# Patient Record
Sex: Female | Born: 1985 | Race: Black or African American | Hispanic: No | Marital: Single | Smoking: Current every day smoker
Health system: Southern US, Community
[De-identification: ages and names within clinical notes are randomized; demographics above are authoritative.]

## PROBLEM LIST (undated history)

## (undated) DIAGNOSIS — F32A Depression, unspecified: Secondary | ICD-10-CM

## (undated) DIAGNOSIS — F1910 Other psychoactive substance abuse, uncomplicated: Secondary | ICD-10-CM

## (undated) DIAGNOSIS — R21 Rash and other nonspecific skin eruption: Secondary | ICD-10-CM

## (undated) DIAGNOSIS — F419 Anxiety disorder, unspecified: Secondary | ICD-10-CM

## (undated) DIAGNOSIS — K602 Anal fissure, unspecified: Secondary | ICD-10-CM

## (undated) DIAGNOSIS — Z8719 Personal history of other diseases of the digestive system: Secondary | ICD-10-CM

## (undated) DIAGNOSIS — F329 Major depressive disorder, single episode, unspecified: Secondary | ICD-10-CM

## (undated) DIAGNOSIS — D649 Anemia, unspecified: Secondary | ICD-10-CM

## (undated) DIAGNOSIS — F319 Bipolar disorder, unspecified: Secondary | ICD-10-CM

## (undated) DIAGNOSIS — Z3490 Encounter for supervision of normal pregnancy, unspecified, unspecified trimester: Secondary | ICD-10-CM

## (undated) HISTORY — DX: Anxiety disorder, unspecified: F41.9

## (undated) HISTORY — DX: Other psychoactive substance abuse, uncomplicated: F19.10

## (undated) HISTORY — DX: Anemia, unspecified: D64.9

## (undated) HISTORY — PX: FRACTURE SURGERY: SHX138

---
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
## 2017-04-29 ENCOUNTER — Encounter (HOSPITAL_COMMUNITY): Payer: Self-pay | Admitting: Emergency Medicine

## 2017-04-29 DIAGNOSIS — R51 Headache: Secondary | ICD-10-CM | POA: Insufficient documentation

## 2017-04-29 LAB — COMPREHENSIVE METABOLIC PANEL
ALBUMIN: 2.9 g/dL — AB (ref 3.5–5.0)
ALT: 12 U/L — AB (ref 14–54)
AST: 18 U/L (ref 15–41)
Alkaline Phosphatase: 69 U/L (ref 38–126)
Anion gap: 8 (ref 5–15)
BUN: 6 mg/dL (ref 6–20)
CHLORIDE: 105 mmol/L (ref 101–111)
CO2: 23 mmol/L (ref 22–32)
CREATININE: 0.54 mg/dL (ref 0.44–1.00)
Calcium: 8.6 mg/dL — ABNORMAL LOW (ref 8.9–10.3)
GFR calc Af Amer: >60 mL/min (ref >60)
GFR calc non Af Amer: >60 mL/min (ref >60)
Glucose, Bld: 122 mg/dL — ABNORMAL HIGH (ref 65–99)
Potassium: 3.9 mmol/L (ref 3.5–5.1)
SODIUM: 136 mmol/L (ref 135–145)
Total Bilirubin: 0.4 mg/dL (ref 0.3–1.2)
Total Protein: 5.8 g/dL — ABNORMAL LOW (ref 6.5–8.1)

## 2017-04-29 LAB — CBC
HCT: 29 % — ABNORMAL LOW (ref 36.0–46.0)
Hemoglobin: 9.3 g/dL — ABNORMAL LOW (ref 12.0–15.0)
MCH: 27.8 pg (ref 26.0–34.0)
MCHC: 32.1 g/dL (ref 30.0–36.0)
MCV: 86.6 fL (ref 78.0–100.0)
PLATELETS: 131 10*3/uL — AB (ref 150–400)
RBC: 3.35 MIL/uL — AB (ref 3.87–5.11)
RDW: 13.6 % (ref 11.5–15.5)
WBC: 8.8 10*3/uL (ref 4.0–10.5)

## 2017-04-29 LAB — URINALYSIS, ROUTINE W REFLEX MICROSCOPIC
Bilirubin Urine: NEGATIVE
GLUCOSE, UA: NEGATIVE mg/dL
HGB URINE DIPSTICK: NEGATIVE
Ketones, ur: NEGATIVE mg/dL
Leukocytes, UA: NEGATIVE
Nitrite: NEGATIVE
PROTEIN: NEGATIVE mg/dL
Specific Gravity, Urine: 1.016 (ref 1.005–1.030)
pH: 6 (ref 5.0–8.0)

## 2017-04-29 NOTE — ED Triage Notes (Signed)
Pt is [redacted] weeks pregnant, reports HA, N/V X1 week. Pt has taken tylenol at home with no relief. Also appears to have rash on abdomen.

## 2017-04-30 ENCOUNTER — Emergency Department (HOSPITAL_COMMUNITY)
Admission: EM | Admit: 2017-04-30 | Discharge: 2017-04-30 | Disposition: A | Discharge status: Home / Self Care | Payer: Self-pay | Attending: Emergency Medicine | Admitting: Emergency Medicine

## 2017-04-30 HISTORY — DX: Encounter for supervision of normal pregnancy, unspecified, unspecified trimester: Z34.90

## 2017-04-30 NOTE — ED Notes (Addendum)
Called for Pt to recheck for vitals. No answer x's 3.

## 2017-04-30 NOTE — ED Notes (Signed)
Pt called for recheck of vitals without answer

## 2018-01-06 ENCOUNTER — Ambulatory Visit (HOSPITAL_COMMUNITY)
Admission: EM | Admit: 2018-01-06 | Discharge: 2018-01-06 | Disposition: A | Discharge status: Home / Self Care | Payer: Self-pay | Attending: Urgent Care | Admitting: Urgent Care

## 2018-01-06 ENCOUNTER — Encounter (HOSPITAL_COMMUNITY): Payer: Self-pay | Admitting: Family Medicine

## 2018-01-06 ENCOUNTER — Telehealth (HOSPITAL_COMMUNITY): Payer: Self-pay | Admitting: Emergency Medicine

## 2018-01-06 DIAGNOSIS — B9789 Other viral agents as the cause of diseases classified elsewhere: Secondary | ICD-10-CM

## 2018-01-06 DIAGNOSIS — J029 Acute pharyngitis, unspecified: Secondary | ICD-10-CM | POA: Insufficient documentation

## 2018-01-06 DIAGNOSIS — R05 Cough: Secondary | ICD-10-CM

## 2018-01-06 DIAGNOSIS — R590 Localized enlarged lymph nodes: Secondary | ICD-10-CM

## 2018-01-06 DIAGNOSIS — R0982 Postnasal drip: Secondary | ICD-10-CM

## 2018-01-06 DIAGNOSIS — J069 Acute upper respiratory infection, unspecified: Secondary | ICD-10-CM

## 2018-01-06 DIAGNOSIS — Z720 Tobacco use: Secondary | ICD-10-CM

## 2018-01-06 DIAGNOSIS — R070 Pain in throat: Secondary | ICD-10-CM

## 2018-01-06 LAB — POCT RAPID STREP A: Streptococcus, Group A Screen (Direct): NEGATIVE

## 2018-01-06 MED ORDER — BENZONATATE 100 MG PO CAPS: 100.0000 mg | ORAL_CAPSULE | Freq: Three times a day (TID) | ORAL | 0 refills | Status: DC | PRN

## 2018-01-06 MED ORDER — HYDROCODONE-HOMATROPINE 5-1.5 MG/5ML PO SYRP: 5.0000 mL | ORAL_SOLUTION | Freq: Every evening | ORAL | 0 refills | Status: DC | PRN

## 2018-01-06 MED ORDER — PSEUDOEPHEDRINE HCL ER 120 MG PO TB12: 120.0000 mg | ORAL_TABLET | Freq: Two times a day (BID) | ORAL | 3 refills | Status: DC

## 2018-01-06 NOTE — ED Provider Notes (Signed)
  MRN: [id number] DOB: 02/23/1986  Subjective:   Mandy LemaGwendolyn Wilson is a 32 y.o. female presenting for 4-day history of sore throat, productive cough, intermittent sinus congestion, intermittent left no pain, currently worse over the right side.  She is also concerned feeling like she has sores in her mouth.  She has tried 1 pill of Augmentin that 1 of her friends gave her.  She is also tried a honey tea.  Denies fever, sinus pain, ear pain, chest pain, belly pain.  Patient smokes cigarettes.  Has a history of allergies but does not take anything consistently for this.  Patient has an IUD in place.  Otherwise, patient does not take any medications.   Allergies  Allergen Reactions  . Shellfish Allergy Hives    Past Medical History:  Diagnosis Date  . Pregnancy      Past Surgical History:  Procedure Laterality Date  . CESAREAN SECTION     X3    Objective:   Vitals: BP (!) 156/95   Pulse (!) 106   Temp 100.3 F (37.9 C)   Resp 18   LMP 11/24/2017   SpO2 99%   Physical Exam  Constitutional: She is oriented to person, place, and time. She appears well-developed and well-nourished.  HENT:  Throat with thick streaks of postnasal drainage.  No sinus tenderness.  TMs intact and without erythema.  Eyes: Right eye exhibits no discharge. Left eye exhibits no discharge. No scleral icterus.  Neck: Normal range of motion. Neck supple.  Right-sided submandibular lymph adenopathy.  Cardiovascular: Normal rate, regular rhythm and intact distal pulses. Exam reveals no gallop and no friction rub.  No murmur heard. Pulmonary/Chest: No respiratory distress. She has no wheezes. She has no rales.  Lymphadenopathy:    She has cervical adenopathy.  Neurological: She is alert and oriented to person, place, and time.  Skin: Skin is warm and dry.  Psychiatric: She has a normal mood and affect.    Results for orders placed or performed during the hospital encounter of 01/06/18 (from the past 24  hour(s))  POCT rapid strep [facility])     Status: None   Collection Time: 01/06/18  5:48 PM  Result Value Ref Range   Streptococcus, Group A Screen (Direct) NEGATIVE NEGATIVE    Assessment and Plan :   Viral URI with cough  Throat pain  Cervical lymphadenopathy  Post-nasal drainage  Tobacco use  Strep culture pending, will manage as a viral upper respiratory infection.  Recommended patient hold off from smoking cigarettes at least until she is better. Counseled patient on potential for adverse effects with medications prescribed today, patient verbalized understanding. Return-to-clinic precautions discussed, patient verbalized understanding.    Wallis BambergMani, Laira Penninger, [place]PA-C 01/06/18 1821

## 2018-01-06 NOTE — ED Triage Notes (Signed)
Pt here for cough, nausea, sore throat and sore in mouth x a few days. Reports some lymphadenopathy and ear pain the first few days on the left  And now to the right. sts sore to tongue and blister.

## 2018-01-06 NOTE — Discharge Instructions (Signed)
Hydrate well with at least 2 liters (1 gallon) of water daily. You may take 500mg  Tylenol with ibuprofen 600mg  every 6 hours for pain and inflammation. For sore throat try using a honey-based tea. Use 3 teaspoons of honey with juice squeezed from half lemon. Place shaved pieces of ginger into 1/2-1 cup of water and warm over stove top. Then mix the ingredients and repeat every 4 hours as needed.

## 2018-01-09 LAB — CULTURE, GROUP A STREP (THRC)

## 2018-03-24 ENCOUNTER — Ambulatory Visit (INDEPENDENT_AMBULATORY_CARE_PROVIDER_SITE_OTHER): Payer: Medicaid Other

## 2018-03-24 ENCOUNTER — Ambulatory Visit (HOSPITAL_COMMUNITY)
Admission: EM | Admit: 2018-03-24 | Discharge: 2018-03-24 | Disposition: A | Discharge status: Home / Self Care | Payer: Medicaid Other | Attending: Family Medicine | Admitting: Family Medicine

## 2018-03-24 ENCOUNTER — Other Ambulatory Visit: Payer: Self-pay

## 2018-03-24 ENCOUNTER — Encounter (HOSPITAL_COMMUNITY): Payer: Self-pay | Admitting: Emergency Medicine

## 2018-03-24 DIAGNOSIS — S9032XA Contusion of left foot, initial encounter: Secondary | ICD-10-CM

## 2018-03-24 DIAGNOSIS — Z3202 Encounter for pregnancy test, result negative: Secondary | ICD-10-CM | POA: Diagnosis not present

## 2018-03-24 DIAGNOSIS — R109 Unspecified abdominal pain: Secondary | ICD-10-CM | POA: Diagnosis not present

## 2018-03-24 DIAGNOSIS — K529 Noninfective gastroenteritis and colitis, unspecified: Secondary | ICD-10-CM

## 2018-03-24 LAB — POCT URINALYSIS DIP (DEVICE)
GLUCOSE, UA: NEGATIVE mg/dL
KETONES UR: 15 mg/dL — AB
LEUKOCYTES UA: NEGATIVE
Nitrite: NEGATIVE
PROTEIN: NEGATIVE mg/dL
UROBILINOGEN UA: 0.2 mg/dL (ref 0.0–1.0)
pH: 6 (ref 5.0–8.0)

## 2018-03-24 LAB — POCT PREGNANCY, URINE: Preg Test, Ur: NEGATIVE

## 2018-03-24 MED ORDER — ONDANSETRON 4 MG PO TBDP
4.0000 mg | ORAL_TABLET | Freq: Once | ORAL | Status: AC
  Administered 2018-03-24: 4 mg via ORAL

## 2018-03-24 MED ORDER — ONDANSETRON HCL 4 MG PO TABS: 4.0000 mg | ORAL_TABLET | Freq: Three times a day (TID) | ORAL | 0 refills | Status: DC | PRN

## 2018-03-24 MED ORDER — ACETAMINOPHEN 500 MG PO TABS: 500.0000 mg | ORAL_TABLET | Freq: Four times a day (QID) | ORAL | 0 refills | Status: DC | PRN

## 2018-03-24 MED ORDER — ONDANSETRON 4 MG PO TBDP
ORAL_TABLET | ORAL | Status: AC
  Filled 2018-03-24: qty 1

## 2018-03-24 MED ORDER — IBUPROFEN 400 MG PO TABS: 400.0000 mg | ORAL_TABLET | Freq: Four times a day (QID) | ORAL | 0 refills | Status: DC | PRN

## 2018-03-24 NOTE — ED Triage Notes (Signed)
Nausea, vomiting and diarrhea, hot and cold flashes for 3 days.    Kicked a toy during the night, pain in top of foot

## 2018-03-24 NOTE — ED Provider Notes (Signed)
[facility]    CSN: [id number] Arrival date & time: 03/24/18  1901     History   Chief Complaint Chief Complaint  Patient presents with  . Emesis    HPI Mandy Wilson is a 32 y.o. female.   Mandy Wilson presents with complaints of body aches, nausea, vomiting and diarrhea which started two days ago. Fatigue. Vomited last approximately 4 hours ago. Has vomited 3 times today. Diarrhea approximately 5 times today. Generalized abdominal pain. No fevers. No cough or congestion. No rash. No known ill contacts. Has been taking clear liquids today. Urinating without difficulty. Has had pelvic pain and irregular periods for the past month, has an IUD and states has had these symptoms since IUD was placed. No change to these symptoms. Took one of her mother's muscle relaxer this morning which seemed to help with body aches. Does not have a PCP.  States also has left foot pain, primarily at 2nd toe. States kicked a wall years ago and then last night kicked a toy in the same area and the pain was triggered again. No numbness or tingling. Ambulatory.    ROS per HPI.      Past Medical History:  Diagnosis Date  . Pregnancy     There are no active problems to display for this patient.   Past Surgical History:  Procedure Laterality Date  . CESAREAN SECTION     X3    OB History    Gravida  1   Para      Term      Preterm      AB      Living        SAB      TAB      Ectopic      Multiple      Live Births               Home Medications    Prior to Admission medications   Medication Sig Start Date End Date Taking? Authorizing Provider  cyclobenzaprine (FLEXERIL) 10 MG tablet Take 10 mg by mouth 3 (three) times daily as needed for muscle spasms.   Yes [provider]  acetaminophen (TYLENOL) 500 MG tablet Take 1 tablet (500 mg total) by mouth every 6 (six) hours as needed. 03/24/18   Georgetta Haber, NP  ibuprofen (ADVIL,MOTRIN) 400 MG tablet  Take 1 tablet (400 mg total) by mouth every 6 (six) hours as needed. 03/24/18   Georgetta Haber, NP  ondansetron (ZOFRAN) 4 MG tablet Take 1 tablet (4 mg total) by mouth every 8 (eight) hours as needed for nausea or vomiting. 03/24/18   Georgetta Haber, NP    Family History Family History  Problem Relation Age of Onset  . Asthma Mother   . Asthma Father     Social History Social History   Tobacco Use  . Smoking status: Current Every Day Smoker  . Smokeless tobacco: Never Used  Substance Use Topics  . Alcohol use: Yes  . Drug use: Yes    Types: Marijuana     Allergies   Shellfish allergy   Review of Systems Review of Systems   Physical Exam Triage Vital Signs ED Triage Vitals  Enc Vitals Group     BP 03/24/18 2011 137/85     Pulse Rate 03/24/18 2011 67     Resp 03/24/18 2011 20     Temp 03/24/18 2011 98.4 F (36.9 C)     Temp  Source 03/24/18 2011 Oral     SpO2 03/24/18 2011 96 %     Weight --      Height --      Head Circumference --      Peak Flow --      Pain Score 03/24/18 2008 10     Pain Loc --      Pain Edu? --      Excl. in GC? --    No data found.  Updated Vital Signs BP 137/85 (BP Location: Right Arm) Comment (BP Location): small cuff  Pulse 67   Temp 98.4 F (36.9 C) (Oral)   Resp 20   LMP 02/24/2018   SpO2 96%    Physical Exam  Constitutional: She is oriented to person, place, and time. She appears well-developed and well-nourished. No distress.  Cardiovascular: Normal rate, regular rhythm and normal heart sounds.  Pulmonary/Chest: Effort normal and breath sounds normal.  Abdominal: Soft. Bowel sounds are normal. There is no tenderness. There is no rigidity, no rebound, no guarding and no CVA tenderness.  Musculoskeletal:       Left ankle: Normal.       Left foot: There is tenderness and bony tenderness. There is normal range of motion, no swelling, normal capillary refill, no crepitus, no deformity and no laceration.        Feet:  Strong cap refill; moving toes, point tenderness over second distal metatarsal, no pain at joint; pedal pulse strong and palpable   Neurological: She is alert and oriented to person, place, and time.  Skin: Skin is warm and dry.     UC Treatments / Results  Labs (all labs ordered are listed, but only abnormal results are displayed) Labs Reviewed  POCT URINALYSIS DIP (DEVICE) - Abnormal; Notable for the following components:      Result Value   Bilirubin Urine SMALL (*)    Ketones, ur 15 (*)    Hgb urine dipstick MODERATE (*)    All other components within normal limits  POCT PREGNANCY, URINE    EKG None  Radiology Dg Foot Complete Left  Result Date: 03/24/2018 CLINICAL DATA:  Tripped over a toy a, pain along the plantar surface EXAM: LEFT FOOT - COMPLETE 3+ VIEW COMPARISON:  None. FINDINGS: No acute displaced fracture or malalignment. No radiopaque foreign body in the soft tissues. IMPRESSION: No acute osseous abnormality Electronically Signed   By: Jasmine PangKim  Fujinaga M.D.   On: 03/24/2018 20:42    Procedures Procedures (including critical care time)  Medications Ordered in UC Medications  ondansetron (ZOFRAN-ODT) disintegrating tablet 4 mg (4 mg Oral Given 03/24/18 2028)    Initial Impression / Assessment and Plan / UC Course  I have reviewed the triage vital signs and the nursing notes.  Pertinent labs & imaging results that were available during my care of the patient were reviewed by me and considered in my medical decision making (see chart for details).     No current vaginal or urinary symptoms. Non tender abdomen on exam. Non toxic in appearance. Vomiting has been decreasing. Has been taking fluids. Likely viral in nature. zofran as needed. Ibuprofen for pain to foot, ice and elevation. Ibuprofen once abdominal symptoms have improved. Continue to follow with PCP and/or gynecologist for IUD follow up- patient states would like it removed. Return precautions  provided. Patient verbalized understanding and agreeable to plan.  Ambulatory out of clinic without difficulty.    Final Clinical Impressions(s) / UC Diagnoses   Final  diagnoses:  Gastroenteritis  Contusion of left foot, initial encounter     Discharge Instructions     Small frequent sips of fluids- Pedialyte, Gatorade, water, broth- to maintain hydration.   Zofran as needed for nausea or vomiting.  Ibuprofen or tylenol for foot pain. Ibuprofen can cause increased abdominal symptoms therefore use once abdominal symptoms have improved. May help with body aches.  Ice and elevation of foot. Please follow up with PCP and/or gyn for IUD/ menstruation management     ED Prescriptions    Medication Sig Dispense Auth. Provider   ondansetron (ZOFRAN) 4 MG tablet Take 1 tablet (4 mg total) by mouth every 8 (eight) hours as needed for nausea or vomiting. 10 tablet Linus Mako B, NP   ibuprofen (ADVIL,MOTRIN) 400 MG tablet Take 1 tablet (400 mg total) by mouth every 6 (six) hours as needed. 30 tablet Linus Mako B, NP   acetaminophen (TYLENOL) 500 MG tablet Take 1 tablet (500 mg total) by mouth every 6 (six) hours as needed. 30 tablet Georgetta Haber, NP     Controlled Substance Prescriptions [place] Controlled Substance Registry consulted? Not Applicable   Georgetta Haber, NP 03/24/18 2053

## 2018-03-24 NOTE — Discharge Instructions (Addendum)
Small frequent sips of fluids- Pedialyte, Gatorade, water, broth- to maintain hydration.   Zofran as needed for nausea or vomiting.  Ibuprofen or tylenol for foot pain. Ibuprofen can cause increased abdominal symptoms therefore use once abdominal symptoms have improved. May help with body aches.  Ice and elevation of foot. Please follow up with PCP and/or gyn for IUD/ menstruation management

## 2018-04-25 ENCOUNTER — Emergency Department (HOSPITAL_COMMUNITY)
Admission: EM | Admit: 2018-04-25 | Discharge: 2018-04-26 | Disposition: A | Discharge status: Home / Self Care | Payer: Medicaid Other | Attending: Emergency Medicine | Admitting: Emergency Medicine

## 2018-04-25 ENCOUNTER — Encounter (HOSPITAL_COMMUNITY): Payer: Self-pay | Admitting: Emergency Medicine

## 2018-04-25 ENCOUNTER — Other Ambulatory Visit: Payer: Self-pay

## 2018-04-25 DIAGNOSIS — T6591XA Toxic effect of unspecified substance, accidental (unintentional), initial encounter: Secondary | ICD-10-CM | POA: Insufficient documentation

## 2018-04-25 DIAGNOSIS — F1220 Cannabis dependence, uncomplicated: Secondary | ICD-10-CM | POA: Insufficient documentation

## 2018-04-25 DIAGNOSIS — F319 Bipolar disorder, unspecified: Secondary | ICD-10-CM | POA: Insufficient documentation

## 2018-04-25 DIAGNOSIS — F1020 Alcohol dependence, uncomplicated: Secondary | ICD-10-CM | POA: Insufficient documentation

## 2018-04-25 DIAGNOSIS — F10920 Alcohol use, unspecified with intoxication, uncomplicated: Secondary | ICD-10-CM | POA: Diagnosis not present

## 2018-04-25 DIAGNOSIS — F17200 Nicotine dependence, unspecified, uncomplicated: Secondary | ICD-10-CM | POA: Diagnosis not present

## 2018-04-25 DIAGNOSIS — F17210 Nicotine dependence, cigarettes, uncomplicated: Secondary | ICD-10-CM | POA: Diagnosis not present

## 2018-04-25 DIAGNOSIS — F4325 Adjustment disorder with mixed disturbance of emotions and conduct: Secondary | ICD-10-CM | POA: Diagnosis not present

## 2018-04-25 DIAGNOSIS — F419 Anxiety disorder, unspecified: Secondary | ICD-10-CM | POA: Diagnosis not present

## 2018-04-25 HISTORY — DX: Depression, unspecified: F32.A

## 2018-04-25 HISTORY — DX: Bipolar disorder, unspecified: F31.9

## 2018-04-25 HISTORY — DX: Major depressive disorder, single episode, unspecified: F32.9

## 2018-04-25 LAB — COMPREHENSIVE METABOLIC PANEL
ALBUMIN: 3.7 g/dL (ref 3.5–5.0)
ALK PHOS: 50 U/L (ref 38–126)
ALT: 31 U/L (ref 0–44)
AST: 26 U/L (ref 15–41)
Anion gap: 10 (ref 5–15)
BUN: 14 mg/dL (ref 6–20)
CHLORIDE: 109 mmol/L (ref 98–111)
CO2: 23 mmol/L (ref 22–32)
CREATININE: 0.74 mg/dL (ref 0.44–1.00)
Calcium: 8.4 mg/dL — ABNORMAL LOW (ref 8.9–10.3)
GFR calc Af Amer: >60 mL/min (ref >60)
GFR calc non Af Amer: >60 mL/min (ref >60)
GLUCOSE: 78 mg/dL (ref 70–99)
Potassium: 3.5 mmol/L (ref 3.5–5.1)
SODIUM: 142 mmol/L (ref 135–145)
Total Bilirubin: 0.6 mg/dL (ref 0.3–1.2)
Total Protein: 6.1 g/dL — ABNORMAL LOW (ref 6.5–8.1)

## 2018-04-25 LAB — I-STAT BETA HCG BLOOD, ED (MC, WL, AP ONLY)

## 2018-04-25 LAB — ACETAMINOPHEN LEVEL

## 2018-04-25 LAB — I-STAT VENOUS BLOOD GAS, ED
ACID-BASE DEFICIT: 2 mmol/L (ref 0.0–2.0)
Bicarbonate: 24.3 mmol/L (ref 20.0–28.0)
O2 Saturation: 75 %
PH VEN: 7.349 (ref 7.250–7.430)
PO2 VEN: 42 mmHg (ref 32.0–45.0)
TCO2: 26 mmol/L (ref 22–32)
pCO2, Ven: 44 mmHg (ref 44.0–60.0)

## 2018-04-25 LAB — RAPID URINE DRUG SCREEN, HOSP PERFORMED
Amphetamines: NOT DETECTED
BARBITURATES: NOT DETECTED
Benzodiazepines: NOT DETECTED
Cocaine: NOT DETECTED
Opiates: NOT DETECTED
Tetrahydrocannabinol: POSITIVE — AB

## 2018-04-25 LAB — CBC
HEMATOCRIT: 37.6 % (ref 36.0–46.0)
Hemoglobin: 11.7 g/dL — ABNORMAL LOW (ref 12.0–15.0)
MCH: 28.6 pg (ref 26.0–34.0)
MCHC: 31.1 g/dL (ref 30.0–36.0)
MCV: 91.9 fL (ref 78.0–100.0)
Platelets: 170 10*3/uL (ref 150–400)
RBC: 4.09 MIL/uL (ref 3.87–5.11)
RDW: 13.6 % (ref 11.5–15.5)
WBC: 5.5 10*3/uL (ref 4.0–10.5)

## 2018-04-25 LAB — ETHANOL: Alcohol, Ethyl (B): 125 mg/dL — ABNORMAL HIGH (ref <10)

## 2018-04-25 LAB — SALICYLATE LEVEL: Salicylate Lvl: <7 mg/dL (ref 2.8–30.0)

## 2018-04-25 MED ORDER — VITAMIN B-1 100 MG PO TABS
100.0000 mg | ORAL_TABLET | Freq: Every day | ORAL | Status: DC
  Administered 2018-04-25: 100 mg via ORAL
  Filled 2018-04-25: qty 1

## 2018-04-25 MED ORDER — LORAZEPAM 1 MG PO TABS: 0.0000 mg | ORAL_TABLET | Freq: Four times a day (QID) | ORAL | Status: DC

## 2018-04-25 MED ORDER — LORAZEPAM 1 MG PO TABS: 0.0000 mg | ORAL_TABLET | Freq: Two times a day (BID) | ORAL | Status: DC

## 2018-04-25 MED ORDER — ACETAMINOPHEN 325 MG PO TABS: 650.0000 mg | ORAL_TABLET | ORAL | Status: DC | PRN

## 2018-04-25 MED ORDER — LORAZEPAM 2 MG/ML IJ SOLN: 0.0000 mg | Freq: Two times a day (BID) | INTRAMUSCULAR | Status: DC

## 2018-04-25 MED ORDER — SODIUM CHLORIDE 0.9 % IV BOLUS
1000.0000 mL | Freq: Once | INTRAVENOUS | Status: AC
  Administered 2018-04-25: 1000 mL via INTRAVENOUS

## 2018-04-25 MED ORDER — ONDANSETRON HCL 4 MG PO TABS: 4.0000 mg | ORAL_TABLET | Freq: Three times a day (TID) | ORAL | Status: DC | PRN

## 2018-04-25 MED ORDER — NALOXONE HCL 0.4 MG/ML IJ SOLN
0.4000 mg | Freq: Once | INTRAMUSCULAR | Status: AC
  Administered 2018-04-25: 0.4 mg via INTRAVENOUS
  Filled 2018-04-25: qty 1

## 2018-04-25 MED ORDER — LORAZEPAM 2 MG/ML IJ SOLN: 0.0000 mg | Freq: Four times a day (QID) | INTRAMUSCULAR | Status: DC

## 2018-04-25 MED ORDER — THIAMINE HCL 100 MG/ML IJ SOLN: 100.0000 mg | Freq: Every day | INTRAMUSCULAR | Status: DC

## 2018-04-25 MED ORDER — ALUM & MAG HYDROXIDE-SIMETH 200-200-20 MG/5ML PO SUSP: 30.0000 mL | Freq: Four times a day (QID) | ORAL | Status: DC | PRN

## 2018-04-25 NOTE — ED Triage Notes (Signed)
Pt BIB [facility] after ingesting an unknown white pill that is prescribed to her mother for pain, and drinking 16oz of brandy. Pt lethargic, but easily aroused. States she has not been on her medication for the last year and has been feeling overwhelmed. Pt denying SI/HI at this time.

## 2018-04-25 NOTE — ED Provider Notes (Signed)
[facility] EMERGENCY DEPARTMENT Provider Note   CSN: [id number] Arrival date & time: 04/25/18  1820     History   Chief Complaint Chief Complaint  Patient presents with  . Ingestion    HPI Mandy Wilson is a 32 y.o. female.  HPI 32 year old female with past medical history of bipolar disorder, depression, here with intoxication.  History limited due to intoxication.  Per report, the patient was drinking because she got into an emotional argument with her significant other, then took a pain pill because her ankle hurts.  She does admit she has been off of her psychiatric medications.  She denies any suicidal ideation but is markedly intoxicated at this time, limiting history.  Level 5 caveat invoked as remainder of history, ROS, and physical exam limited due to patient's intoxication/AMS.   Past Medical History:  Diagnosis Date  . Bipolar 1 disorder (HCC)   . Depression   . Pregnancy     There are no active problems to display for this patient.   Past Surgical History:  Procedure Laterality Date  . CESAREAN SECTION     X3     OB History    Gravida  1   Para      Term      Preterm      AB      Living        SAB      TAB      Ectopic      Multiple      Live Births               Home Medications    Prior to Admission medications   Medication Sig Start Date End Date Taking? Authorizing Provider  ibuprofen (ADVIL,MOTRIN) 200 MG tablet Take 200-400 mg by mouth every 6 (six) hours as needed for headache (pain).   Yes [provider]  acetaminophen (TYLENOL) 500 MG tablet Take 1 tablet (500 mg total) by mouth every 6 (six) hours as needed. Patient not taking: Reported on 04/25/2018 03/24/18   Linus Mako B, NP  ibuprofen (ADVIL,MOTRIN) 400 MG tablet Take 1 tablet (400 mg total) by mouth every 6 (six) hours as needed. Patient not taking: Reported on 04/25/2018 03/24/18   Linus Mako B, NP  ondansetron (ZOFRAN) 4 MG tablet  Take 1 tablet (4 mg total) by mouth every 8 (eight) hours as needed for nausea or vomiting. Patient not taking: Reported on 04/25/2018 03/24/18   Georgetta Haber, NP    Family History Family History  Problem Relation Age of Onset  . Asthma Mother   . Asthma Father     Social History Social History   Tobacco Use  . Smoking status: Current Every Day Smoker  . Smokeless tobacco: Never Used  Substance Use Topics  . Alcohol use: Yes  . Drug use: Yes    Types: Marijuana     Allergies   Shellfish allergy   Review of Systems Review of Systems  Unable to perform ROS: Mental status change     Physical Exam Updated Vital Signs BP 116/71   Pulse 74   Temp (!) 97.5 F (36.4 C) (Temporal)   Resp 18   LMP 02/24/2018   SpO2 98%   Breastfeeding? Unknown   Physical Exam  Constitutional: She appears well-developed and well-nourished. No distress.  HENT:  Head: Normocephalic and atraumatic.  No apparent head trauma  Eyes: Conjunctivae are normal.  Conjunctival injection b/l, pinpoint pupils  Neck: Neck supple.  Cardiovascular: Normal rate, regular rhythm and normal heart sounds. Exam reveals no friction rub.  No murmur heard. Pulmonary/Chest: Effort normal and breath sounds normal. No respiratory distress. She has no wheezes. She has no rales.  Abdominal: She exhibits no distension.  Musculoskeletal: She exhibits no edema.  Neurological: She is alert. She exhibits normal muscle tone.  Oriented to person and place, not time. Speech is slurred. Easily falls asleep  Skin: Skin is warm. Capillary refill takes less than 2 seconds.  Psychiatric: She has a normal mood and affect.  Nursing note and vitals reviewed.    ED Treatments / Results  Labs (all labs ordered are listed, but only abnormal results are displayed) Labs Reviewed  COMPREHENSIVE METABOLIC PANEL - Abnormal; Notable for the following components:      Result Value   Calcium 8.4 (*)    Total Protein 6.1 (*)     All other components within normal limits  ETHANOL - Abnormal; Notable for the following components:   Alcohol, Ethyl (B) 125 (*)    All other components within normal limits  CBC - Abnormal; Notable for the following components:   Hemoglobin 11.7 (*)    All other components within normal limits  ACETAMINOPHEN LEVEL - Abnormal; Notable for the following components:   Acetaminophen (Tylenol), Serum <10 (*)    All other components within normal limits  SALICYLATE LEVEL  RAPID URINE DRUG SCREEN, HOSP PERFORMED  I-STAT BETA HCG BLOOD, ED ([facility], [facility], [facility] ONLY)  I-STAT VENOUS BLOOD GAS, ED    EKG None  Radiology No results found.  Procedures Procedures (including critical care time)  Medications Ordered in ED Medications  ondansetron (ZOFRAN) tablet 4 mg (has no administration in time range)  alum & mag hydroxide-simeth (MAALOX/MYLANTA) 200-200-20 MG/5ML suspension 30 mL (has no administration in time range)  acetaminophen (TYLENOL) tablet 650 mg (has no administration in time range)  LORazepam (ATIVAN) injection 0-4 mg (has no administration in time range)    Or  LORazepam (ATIVAN) tablet 0-4 mg (has no administration in time range)  LORazepam (ATIVAN) injection 0-4 mg (has no administration in time range)    Or  LORazepam (ATIVAN) tablet 0-4 mg (has no administration in time range)  thiamine (VITAMIN B-1) tablet 100 mg (has no administration in time range)    Or  thiamine (B-1) injection 100 mg (has no administration in time range)  naloxone [facility](NARCAN) injection 0.4 mg (0.4 mg Intravenous Given 04/25/18 1900)  sodium chloride 0.9 % bolus 1,000 mL (1,000 mLs Intravenous New Bag/Given 04/25/18 1904)     Initial Impression / Assessment and Plan / ED Course  I have reviewed the triage vital signs and the nursing notes.  Pertinent labs & imaging results that were available during my care of the patient were reviewed by me and considered in my medical decision making (see chart for  details).   32 year old female here with intoxication after drinking and taking unknown pain medication.  Patient given Narcan and monitored in the ED.  She is increasingly sober now awake and alert.  She does state that taking the opiate was accidental and it was given to her by her mother due to chronic foot pain.  However, she does state that she has been drinking more than usual and is requesting psychiatric evaluation given increasing agitation and outbursts in the setting of not taking her bipolar meds since her 8471-month-old child was born.  Will consult TTS.  Otherwise, she is medically  stable for psychiatric disposition currently.   Final Clinical Impressions(s) / ED Diagnoses   Final diagnoses:  Accidental ingestion of substance, initial encounter  Alcoholic intoxication without complication [facility])    ED Discharge Orders    None       Shaune Pollack, MD 04/25/18 2108

## 2018-04-25 NOTE — Progress Notes (Signed)
Called charge nurse and she states pt is being transferred to [facility]. TTS to contact RN to set up telepsych cart once pt has been moved.   Mandy Wilson, MSW, LCSW Therapeutic Triage Specialist  [phone]

## 2018-04-25 NOTE — Progress Notes (Signed)
Per [name], DNP pt is recommended for continued observation for safety and stabilization. Pt to be reassessed in the AM by psych. EDP Mandy Wilson, Cameron, MD and pt's nurse Mandy PicketScott, RN have been advised of the recommendation.   Mandy Wilson, MSW, LCSW Therapeutic Triage Specialist  [phone]

## 2018-04-25 NOTE — ED Notes (Signed)
Patient speaking loudly on the phone with her "baby daddy", agitated. Patient apologized and calmed down, now cooperative.

## 2018-04-25 NOTE — ED Notes (Signed)
Valuables placed with security. 

## 2018-04-25 NOTE — BH Assessment (Addendum)
Tele Assessment Note   Patient Name: Mandy Wilson MRN: [id number] Referring Physician: Shaune PollackIsaacs, Cameron, MD Location of Patient: [facility] Location of Provider: Behavioral Health TTS Department  Mandy Wilson is an 32 y.o. female who presents to the ED VOL. TTS asked the pt what concerns prompted her to come to the ED and she states "my daughter spilled some water and it went all crazy." TTS asked the pt to provide clarity on "all crazy" and pt states she began to yell at her 31 year old daughter. Pt denies that any violence took place during the incident.   Pt admits to taking an unknown pill given to her by her mother due to foot pain. Pt also admits to drinking heavy amounts of alcohol and states she drinks "sometimes a little, sometimes a lot." Pt's current BAL is 125 on arrival to ED. Pt also admits to using cannabis weekly. Pt denies SI, HI, and AVH. Pt states she did not intentionally OD on medication however per chart, pt was lethargic on arrival to ED and had to be administered narcan while in the ED.   Per [name], DNP pt is recommended for continued observation for safety and stabilization. Pt to be reassessed in the AM by psych. EDP Shaune PollackIsaacs, Cameron, MD and pt's nurse Lorin PicketScott, RN have been advised of the recommendation.   Diagnosis: Bipolar I disorder, per hx; Alcohol use disorder, severe; Cannabis use disorder, severe   Past Medical History:  Past Medical History:  Diagnosis Date  . Bipolar 1 disorder (HCC)   . Depression   . Pregnancy     Past Surgical History:  Procedure Laterality Date  . CESAREAN SECTION     X3    Family History:  Family History  Problem Relation Age of Onset  . Asthma Mother   . Asthma Father     Social History:  reports that she has been smoking.  She has never used smokeless tobacco. She reports that she drinks alcohol. She reports that she has current or past drug history. Drug: Marijuana.  Additional Social History:  Alcohol / Drug  Use Pain Medications: See MAR Prescriptions: See MAR Over the Counter: See MAR History of alcohol / drug use?: Yes Substance #1 Name of Substance 1: Alcohol 1 - Age of First Use: 22 1 - Amount (size/oz): varies 1 - Frequency: social occasions 1 - Duration: ongoing 1 - Last Use / Amount: 04/25/18 Substance #2 Name of Substance 2: Cannabis 2 - Age of First Use: 15 2 - Amount (size/oz): varies 2 - Frequency: 1x/week 2 - Duration: ongoing 2 - Last Use / Amount: 04/25/18  CIWA: CIWA-Ar BP: 121/74 Pulse Rate: 90 Nausea and Vomiting: no nausea and no vomiting Tactile Disturbances: none Tremor: no tremor Auditory Disturbances: not present Paroxysmal Sweats: no sweat visible Visual Disturbances: not present Anxiety: no anxiety, at ease Headache, Fullness in Head: none present Agitation: two Orientation and Clouding of Sensorium: oriented and can do serial additions CIWA-Ar Total: 2 COWS:    Allergies:  Allergies  Allergen Reactions  . Shellfish Allergy Hives    Home Medications:  (Not in a hospital admission)  OB/GYN Status:  Patient's last menstrual period was 02/24/2018.  General Assessment Data Assessment unable to be completed: Yes Reason for not completing assessment: Called charge nurse and she states pt is being transferred to POD F. TTS to contact RN to set up telepsych cart once pt has been moved.  [facility] Assessment: In system  Is this a Tele or Face-to-Face Assessment?: Tele Assessment Is this an Initial Assessment or a Re-assessment for this encounter?: Initial Assessment Marital status: Single Is patient pregnant?: No Pregnancy Status: No Living Arrangements: Children, Parent Can pt return to current living arrangement?: Yes Admission Status: Voluntary Is patient capable of signing voluntary admission?: Yes Referral Source: Self/Family/Friend Insurance type: Medicaid     Crisis Care Plan Living Arrangements: Children,  Parent Name of Psychiatrist: none Name of Therapist: none  Education Status Is patient currently in school?: No Is the patient employed, unemployed or receiving disability?: Unemployed  Risk to self with the past 6 months Suicidal Ideation: No Has patient been a risk to self within the past 6 months prior to admission? : Yes(accidental OD) Suicidal Intent: No Has patient had any suicidal intent within the past 6 months prior to admission? : No Is patient at risk for suicide?: No Suicidal Plan?: No Has patient had any suicidal plan within the past 6 months prior to admission? : No Access to Means: No What has been your use of drugs/alcohol within the last 12 months?: frequent alcohol and cannabis use  Previous Attempts/Gestures: No Triggers for Past Attempts: None known Intentional Self Injurious Behavior: None Family Suicide History: No Recent stressful life event(s): Financial Problems, Job Loss(not working, increased alcohol use ) Persecutory voices/beliefs?: No Depression: No Substance abuse history and/or treatment for substance abuse?: Yes Suicide prevention information given to non-admitted patients: Not applicable  Risk to Others within the past 6 months Homicidal Ideation: No Does patient have any lifetime risk of violence toward others beyond the six months prior to admission? : No Thoughts of Harm to Others: No Current Homicidal Intent: No Current Homicidal Plan: No Access to Homicidal Means: No History of harm to others?: No Assessment of Violence: None Noted Does patient have access to weapons?: No Criminal Charges Pending?: No Does patient have a court date: No Is patient on probation?: No  Psychosis Hallucinations: None noted Delusions: None noted  Mental Status Report Appearance/Hygiene: In scrubs Eye Contact: Poor(pt falling asleep) Motor Activity: Freedom of movement Speech: Slow Level of Consciousness: Drowsy Mood: Euthymic Affect: Flat Anxiety  Level: None Thought Processes: Relevant, Coherent Judgement: Partial Orientation: Person, Place, Time, Situation Obsessive Compulsive Thoughts/Behaviors: None  Cognitive Functioning Concentration: Poor Memory: Remote Intact, Recent Intact Is patient IDD: No Is patient DD?: No Insight: Poor Impulse Control: Fair Appetite: Good Have you had any weight changes? : No Change Sleep: Decreased Total Hours of Sleep: 3 Vegetative Symptoms: None  ADLScreening [facility] Assessment Services) Patient's cognitive ability adequate to safely complete daily activities?: Yes Patient able to express need for assistance with ADLs?: Yes Independently performs ADLs?: Yes (appropriate for developmental age)  Prior Inpatient Therapy Prior Inpatient Therapy: No  Prior Outpatient Therapy Prior Outpatient Therapy: No Does patient have an ACCT team?: No Does patient have Intensive In-House Services?  : No Does patient have [facility] services? : No Does patient have P4CC services?: No  ADL Screening (condition at time of admission) Patient's cognitive ability adequate to safely complete daily activities?: Yes Is the patient deaf or have difficulty hearing?: No Does the patient have difficulty seeing, even when wearing glasses/contacts?: No Does the patient have difficulty concentrating, remembering, or making decisions?: No Patient able to express need for assistance with ADLs?: Yes Does the patient have difficulty dressing or bathing?: No Independently performs ADLs?: Yes (appropriate for developmental age) Does the patient have difficulty walking or climbing stairs?: No Weakness of Legs: None  Weakness of Arms/Hands: None  Home Assistive Devices/Equipment Home Assistive Devices/Equipment: None    Abuse/Neglect Assessment (Assessment to be complete while patient is alone) Abuse/Neglect Assessment Can Be Completed: Yes Physical Abuse: Denies Verbal Abuse: Denies Sexual Abuse: Denies Exploitation of  patient/patient's resources: Denies Self-Neglect: Denies     Merchant navy officer (For Healthcare) Does Patient Have a Medical Advance Directive?: No Would patient like information on creating a medical advance directive?: No - Patient declined    Additional Information 1:1 In Past 12 Months?: No CIRT Risk: No Elopement Risk: No Does patient have medical clearance?: Yes     Disposition: Per Nanine Means, DNP pt is recommended for continued observation for safety and stabilization. Pt to be reassessed in the AM by psych. EDP Shaune Pollack, MD and pt's nurse Lorin Picket, RN have been advised of the recommendation.  Disposition Initial Assessment Completed for this Encounter: Yes Disposition of Patient: (overnight OBS per Nanine Means, DNP) Patient refused recommended treatment: No  This service was provided via telemedicine using a 2-way, interactive audio and video technology.  Names of all persons participating in this telemedicine service and their role in this encounter. Name: Connor Foxworthy Role: Patient  Name: Princess Bruins Role: TTS          Karolee Ohs 04/25/2018 11:03 PM

## 2018-04-26 DIAGNOSIS — F4325 Adjustment disorder with mixed disturbance of emotions and conduct: Secondary | ICD-10-CM

## 2018-04-26 DIAGNOSIS — F17210 Nicotine dependence, cigarettes, uncomplicated: Secondary | ICD-10-CM

## 2018-04-26 DIAGNOSIS — F419 Anxiety disorder, unspecified: Secondary | ICD-10-CM

## 2018-04-26 MED ORDER — ZIPRASIDONE MESYLATE 20 MG IM SOLR
20.0000 mg | Freq: Once | INTRAMUSCULAR | Status: AC
  Administered 2018-04-26: 20 mg via INTRAMUSCULAR
  Filled 2018-04-26: qty 20

## 2018-04-26 MED ORDER — CARBAMAZEPINE 200 MG PO TABS
200.0000 mg | ORAL_TABLET | Freq: Two times a day (BID) | ORAL | Status: DC
  Administered 2018-04-26: 200 mg via ORAL
  Filled 2018-04-26: qty 1

## 2018-04-26 MED ORDER — CARBAMAZEPINE 200 MG PO TABS: 200.0000 mg | ORAL_TABLET | Freq: Two times a day (BID) | ORAL | 0 refills | Status: DC

## 2018-04-26 MED ORDER — STERILE WATER FOR INJECTION IJ SOLN
INTRAMUSCULAR | Status: AC
  Administered 2018-04-26: 05:00:00
  Filled 2018-04-26: qty 10

## 2018-04-26 NOTE — ED Notes (Signed)
ALL belongings - 1 labeled bag and 1 valuables envelope - returned to pt - Pt signed verifying all items present.

## 2018-04-26 NOTE — ED Notes (Signed)
Dr. Judd Lienelo notified on pt.'s agitation/verbally abusive with staff , pt. became upset with RN while he was explaining policy regarding snacks and schedule for breakfast , NT attempted to deescalate pt.'s agitation but failed .

## 2018-04-26 NOTE — ED Notes (Signed)
Bus pass given as requested.  

## 2018-04-26 NOTE — ED Notes (Signed)
Pt calling her mother for assistance w/obtaining Benedetto GoadUber for ride from ED.

## 2018-04-26 NOTE — Progress Notes (Signed)
Pt asked if she could get something to eat ;this sitter and nurse notified pt that breakfast is not until 7 am. Pt became aggravated and approach the nurses station being hostile with staff, verbal redirect is not working so security was called.

## 2018-04-26 NOTE — ED Notes (Signed)
Pt noted to be tearful - states she wants to be d/c'd. States she called the crisis line last pm d/t she became upset at her child for spilling water and was unable to calm herself down. States her baby's father suggested she come to hospital to get back on her meds that she has been off of for a year. States she drank "a small bottle of Brandy that I found while I was on the phone". States she asked her mother to bring her pain med for her abd and foot pain - which she took. Denies this being an SI attempt and denies SI/HI. States "I feel like I'm going to go crazy sitting here". Encouraged pt to wait for re-eval this am. Offered pt to shower - states she may later.

## 2018-04-26 NOTE — Consult Note (Addendum)
[facility]  04/26/2018 10:03 AM Mandy Wilson  MRN:  [id number] Principal Problem: Adjustment disorder with mixed disturbance of emotions and conduct Discharge Diagnoses:  Patient Active Problem List   Diagnosis Date Noted  . Adjustment disorder with mixed disturbance of emotions and conduct [F43.25] 04/26/2018    Priority: High    Subjective: 32 yo female who was drinking alcohol and got upset when her mother gave her a pain pill "that made me incoherent."  Denies suicidal ideations or suicide attempt, past or present.  She was on medications for depression and sleep prior to pregnancy and would like to restart.  Her depression started a few weeks ago and has increased with stress, medications make it better, 4/10 depression.  Alcohol made it worse.  She reports difficulty controlling her anger and wants to go to therapy for assistance.  Pleasant, calm, cooperative on assessment.  Denies homicidal ideations, hallucinations, and substance abuse issues.  Discussed medications and decided on Tegretol for mood and depression.  [facility]  Total Time spent with patient: 45 minutes  Past Psychiatric History: depression and anxiety  Past Medical History:  Past Medical History:  Diagnosis Date  . Bipolar 1 disorder (HCC)   . Depression   . Pregnancy    Past Surgical History:  Procedure Laterality Date  . CESAREAN SECTION     X3   Family History:  Family History  Problem Relation Age of Onset  . Asthma Mother   . Asthma Father    Family Psychiatric  History: none Social History:  Social History   Substance and Sexual Activity  Alcohol Use Yes    Social History   Substance and Sexual Activity  Drug Use Yes  . Types: Marijuana   Social History   Socioeconomic History  . Marital status: Single    Spouse name: Not on file  . Number of children: Not on file  . Years of education: Not on file  . Highest education level: Not on file  Occupational  History  . Not on file  Social Needs  . Financial resource strain: Not on file  . Food insecurity:    Worry: Not on file    Inability: Not on file  . Transportation needs:    Medical: Not on file    Non-medical: Not on file  Tobacco Use  . Smoking status: Current Every Day Smoker  . Smokeless tobacco: Never Used  Substance and Sexual Activity  . Alcohol use: Yes  . Drug use: Yes    Types: Marijuana  . Sexual activity: Not on file  Lifestyle  . Physical activity:    Days per week: Not on file    Minutes per session: Not on file  . Stress: Not on file  Relationships  . Social connections:    Talks on phone: Not on file    Gets together: Not on file    Attends religious service: Not on file    Active member of club or organization: Not on file    Attends meetings of clubs or organizations: Not on file    Relationship status: Not on file  Other Topics Concern  . Not on file  Social History Narrative  . Not on file    Has this patient used any form of tobacco in the last 30 days? (Cigarettes, Smokeless Tobacco, Cigars, and/or Pipes) none  Current Medications: Current Facility-Administered Medications  Medication Dose Route Frequency Provider Last Rate Last Dose  .  carbamazepine (TEGRETOL) tablet 200 mg  200 mg Oral BID Charm Rings, NP      . ondansetron [facility]) tablet 4 mg  4 mg Oral Q8H PRN Shaune Pollack, MD       Current Outpatient Medications  Medication Sig Dispense Refill  . ibuprofen (ADVIL,MOTRIN) 200 MG tablet Take 200-400 mg by mouth every 6 (six) hours as needed for headache (pain).    Marland Kitchen acetaminophen (TYLENOL) 500 MG tablet Take 1 tablet (500 mg total) by mouth every 6 (six) hours as needed. (Patient not taking: Reported on 04/25/2018) 30 tablet 0  . ibuprofen (ADVIL,MOTRIN) 400 MG tablet Take 1 tablet (400 mg total) by mouth every 6 (six) hours as needed. (Patient not taking: Reported on 04/25/2018) 30 tablet 0  . ondansetron (ZOFRAN) 4 MG tablet Take 1  tablet (4 mg total) by mouth every 8 (eight) hours as needed for nausea or vomiting. (Patient not taking: Reported on 04/25/2018) 10 tablet 0   PTA Medications:  (Not in a hospital admission)  Musculoskeletal: Strength & Muscle Tone: within normal limits Gait & Station: normal Patient leans: N/A  Psychiatric Specialty Exam: Physical Exam  Nursing note and vitals reviewed. Constitutional: She is oriented to person, place, and time. She appears well-developed and well-nourished.  HENT:  Head: Normocephalic.  Neck: Normal range of motion.  Cardiovascular: Normal rate.  Respiratory: Effort normal.  Musculoskeletal: Normal range of motion.  Neurological: She is alert and oriented to person, place, and time.  Psychiatric: Her behavior is normal. Thought content normal. Her mood appears anxious. Her affect is blunt. Cognition and memory are normal. She expresses impulsivity.    Review of Systems  Psychiatric/Behavioral: Positive for substance abuse. The patient is nervous/anxious.   All other systems reviewed and are negative.   Blood pressure 124/70, pulse 60, temperature 98.4 F (36.9 C), temperature source Oral, resp. rate 18, last menstrual period 02/24/2018, SpO2 99 %, unknown if currently breastfeeding.There is no height or weight on file to calculate BMI.  General Appearance: Casual  Eye Contact:  Good  Speech:  Normal Rate  Volume:  Normal  Mood:  Anxious  Affect:  Blunt  Thought Process:  Coherent and Descriptions of Associations: Intact  Orientation:  Full (Time, Place, and Person)  Thought Content:  WDL and Logical  Suicidal Thoughts:  No  Homicidal Thoughts:  No  Memory:  Immediate;   Good Recent;   Good Remote;   Good  Judgement:  Fair  Insight:  Fair  Psychomotor Activity:  Normal  Concentration:  Concentration: Good and Attention Span: Good  Recall:  Good  Fund of Knowledge:  Good  Language:  Good  Akathisia:  No  Handed:  Right  AIMS (if indicated):      Assets:  Housing Leisure Time Physical Health Resilience Social Support  ADL's:  Intact  Cognition:  WNL  Sleep:        Demographic Factors:  NA  Loss Factors: NA  Historical Factors: Impulsivity  Risk Reduction Factors:   Responsible for children under 42 years of age, Sense of responsibility to family, Employed, Living with another person, especially a relative, Positive social support and Positive therapeutic relationship  Continued Clinical Symptoms:  Anxiety   Cognitive Features That Contribute To Risk:  None    Suicide Risk:  Minimal: No identifiable suicidal ideation.  Patients presenting with no risk factors but with morbid ruminations; may be classified as minimal risk based on the severity of the depressive symptoms  Plan Of Care/Follow-up recommendations:  Activity:  as tolerated Diet:  heart healthy diet  Disposition: discharge home, follow up with [facility] on 8/13  Nanine Means, NP 04/26/2018, 10:03 AM  Patient seen face-to-face for psychiatric evaluation, chart reviewed and case discussed with the physician extender and developed treatment plan. Reviewed the information documented and agree with the treatment plan. Thedore Mins, MD

## 2018-04-26 NOTE — ED Notes (Signed)
Pt.was crusing at the nurse wanting to no why she was here and she could not leave.she just to get on her bipolar meds.she start to act out.she had security nurse staff to get geodon.she was crying ,wanted something to was given sandwich and soft drink.

## 2018-12-02 ENCOUNTER — Encounter (HOSPITAL_COMMUNITY): Payer: Self-pay

## 2018-12-02 ENCOUNTER — Ambulatory Visit (HOSPITAL_COMMUNITY)
Admission: EM | Admit: 2018-12-02 | Discharge: 2018-12-02 | Disposition: A | Discharge status: Home / Self Care | Payer: Medicaid Other | Attending: Family Medicine | Admitting: Family Medicine

## 2018-12-02 DIAGNOSIS — B9789 Other viral agents as the cause of diseases classified elsewhere: Secondary | ICD-10-CM

## 2018-12-02 DIAGNOSIS — J069 Acute upper respiratory infection, unspecified: Secondary | ICD-10-CM

## 2018-12-02 MED ORDER — GUAIFENESIN-CODEINE 100-10 MG/5ML PO SOLN: 5.0000 mL | Freq: Three times a day (TID) | ORAL | 0 refills | Status: DC | PRN

## 2018-12-02 NOTE — Discharge Instructions (Signed)
Please try things such as zyrtec-D or allegra-D which is an antihistamine and decongestant.  °Please try honey, vick's vapor rub, lozenges and humidifer for cough and sore throat  °Please follow up if your symptoms fail to improve.  °

## 2018-12-02 NOTE — ED Triage Notes (Signed)
Pt c/o cough, fever, sore throat and ear fullness x2 days

## 2018-12-02 NOTE — ED Provider Notes (Signed)
[facility]    CSN: [id number] Arrival date & time: 12/02/18  1953     History   Chief Complaint Chief Complaint  Patient presents with  . Cough    HPI Mandy Wilson is a 33 y.o. female.   She is presenting with 1 day of cough sore throat and congestion.  She works at [employer].  Her symptoms seem to be getting worse.  Has not had any improvement with all modalities.  Has been around others with similar symptoms.  Reports that she has felt warm.  No rashes.  No recent travel.  HPI  Past Medical History:  Diagnosis Date  . Bipolar 1 disorder (HCC)   . Depression   . Pregnancy     Patient Active Problem List   Diagnosis Date Noted  . 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 CESAREAN SECTION     X3    OB History    Gravida  1   Para      Term      Preterm      AB      Living        SAB      TAB      Ectopic      Multiple      Live Births               Home Medications    Prior to Admission medications   Medication Sig Start Date End Date Taking? Authorizing Provider  acetaminophen (TYLENOL) 500 MG tablet Take 1 tablet (500 mg total) by mouth every 6 (six) hours as needed. Patient not taking: Reported on 04/25/2018 03/24/18   Linus Mako B, NP  carbamazepine (TEGRETOL) 200 MG tablet Take 1 tablet (200 mg total) by mouth 2 (two) times daily. 04/26/18   Laveda Abbe, NP  guaiFENesin-codeine 100-10 MG/5ML syrup Take 5 mLs by mouth 3 (three) times daily as needed for cough. 12/02/18   Myra Rude, MD  ibuprofen (ADVIL,MOTRIN) 400 MG tablet Take 1 tablet (400 mg total) by mouth every 6 (six) hours as needed. Patient not taking: Reported on 04/25/2018 03/24/18   Linus Mako B, NP  ondansetron (ZOFRAN) 4 MG tablet Take 1 tablet (4 mg total) by mouth every 8 (eight) hours as needed for nausea or vomiting. Patient not taking: Reported on  04/25/2018 03/24/18   Georgetta Haber, NP    Family History Family History  Problem Relation Age of Onset  . Asthma Mother   . Asthma Father     Social History Social History   Tobacco Use  . Smoking status: Current Every Day Smoker  . Smokeless tobacco: Never Used  Substance Use Topics  . Alcohol use: Yes  . Drug use: Yes    Types: Marijuana     Allergies   Shellfish allergy   Review of Systems Review of Systems  HENT: Positive for congestion and sore throat.   Respiratory: Positive for cough.   Cardiovascular: Negative for chest pain.  Gastrointestinal: Negative for abdominal pain.  Musculoskeletal: Negative for back pain.     Physical Exam Triage Vital Signs ED Triage Vitals  Enc Vitals Group     BP 12/02/18 2012 116/71     Pulse Rate 12/02/18 2012 83     Resp 12/02/18 2012 18     Temp 12/02/18 2012 99.7 F (37.6 C)  Temp Source 12/02/18 2012 Temporal     SpO2 12/02/18 2012 99 %     Weight --      Height --      Head Circumference --      Peak Flow --      Pain Score 12/02/18 2013 7     Pain Loc --      Pain Edu? --      Excl. in GC? --    No data found.  Updated Vital Signs BP 116/71 (BP Location: Right Arm)   Pulse 83   Temp 99.7 F (37.6 C) (Temporal)   Resp 18   LMP 10/31/2018   SpO2 99%   Visual Acuity Right Eye Distance:   Left Eye Distance:   Bilateral Distance:    Right Eye Near:   Left Eye Near:    Bilateral Near:     Physical Exam Gen: NAD, alert, cooperative with exam,  ENT: normal lips, normal nasal mucosa, tympanic membranes clear and intact bilaterally, normal oropharynx, no cervical lymphadenopathy Eye: normal EOM, normal conjunctiva and lids CV:  no edema, +2 pedal pulses, regular rate and rhythm, S1-S2   Resp: no accessory muscle use, non-labored, clear to auscultation bilaterally, no crackles or wheezes  Skin: no rashes, no areas of induration  Neuro: normal tone, normal sensation to touch Psych:  normal  insight, alert and oriented MSK: Normal gait, normal strength    UC Treatments / Results  Labs (all labs ordered are listed, but only abnormal results are displayed) Labs Reviewed - No data to display  EKG None  Radiology No results found.  Procedures Procedures (including critical care time)  Medications Ordered in UC Medications - No data to display  Initial Impression / Assessment and Plan / UC Course  I have reviewed the triage vital signs and the nursing notes.  Pertinent labs & imaging results that were available during my care of the patient were reviewed by me and considered in my medical decision making (see chart for details).     Mandy Wilson is a 33 year old female is presenting with symptoms suggestive of a viral URI.  Counseled on supportive care.  Provided with cough medicine.  Provided a work note for excusing from work for the next 2 days.  Given indications to return and follow-up.  Final Clinical Impressions(s) / UC Diagnoses   Final diagnoses:  Viral URI with cough     Discharge Instructions     Please try things such as zyrtec-D or allegra-D which is an antihistamine and decongestant.  Please try honey, vick's vapor rub, lozenges and humidifer for cough and sore throat  Please follow up if your symptoms fail to improve.      ED Prescriptions    Medication Sig Dispense Auth. Provider   guaiFENesin-codeine 100-10 MG/5ML syrup Take 5 mLs by mouth 3 (three) times daily as needed for cough. 100 mL Myra Rude, MD     Controlled Substance Prescriptions [place] Controlled Substance Registry consulted? No   Myra Rude, MD 12/02/18 2145

## 2019-01-02 ENCOUNTER — Encounter (HOSPITAL_COMMUNITY): Payer: Self-pay

## 2019-01-02 ENCOUNTER — Ambulatory Visit (HOSPITAL_COMMUNITY)
Admission: EM | Admit: 2019-01-02 | Discharge: 2019-01-02 | Disposition: A | Discharge status: Home / Self Care | Payer: Medicaid Other | Attending: Family Medicine | Admitting: Family Medicine

## 2019-01-02 ENCOUNTER — Other Ambulatory Visit: Payer: Self-pay

## 2019-01-02 DIAGNOSIS — M79672 Pain in left foot: Secondary | ICD-10-CM

## 2019-01-02 DIAGNOSIS — Z3202 Encounter for pregnancy test, result negative: Secondary | ICD-10-CM

## 2019-01-02 LAB — POCT URINALYSIS DIP (DEVICE)
Bilirubin Urine: NEGATIVE
Glucose, UA: NEGATIVE mg/dL
Ketones, ur: NEGATIVE mg/dL
Leukocytes,Ua: NEGATIVE
Nitrite: NEGATIVE
Protein, ur: NEGATIVE mg/dL
Specific Gravity, Urine: >=1.03 (ref 1.005–1.030)
Urobilinogen, UA: 0.2 mg/dL (ref 0.0–1.0)
pH: 7 (ref 5.0–8.0)

## 2019-01-02 LAB — POCT PREGNANCY, URINE: Preg Test, Ur: NEGATIVE

## 2019-01-02 MED ORDER — DICLOFENAC SODIUM 75 MG PO TBEC: 75.0000 mg | DELAYED_RELEASE_TABLET | Freq: Two times a day (BID) | ORAL | 0 refills | Status: DC

## 2019-01-02 MED ORDER — METHYLPREDNISOLONE SODIUM SUCC 125 MG IJ SOLR
125.0000 mg | Freq: Once | INTRAMUSCULAR | Status: AC
  Administered 2019-01-02: 125 mg via INTRAMUSCULAR

## 2019-01-02 MED ORDER — METHYLPREDNISOLONE SODIUM SUCC 125 MG IJ SOLR
INTRAMUSCULAR | Status: AC
  Filled 2019-01-02: qty 2

## 2019-01-02 NOTE — ED Provider Notes (Signed)
[facility]    CSN: [id number] Arrival date & time: 01/02/19  0601     History   Chief Complaint Chief Complaint  Patient presents with  . Foot Pain    HPI Mandy Wilson is a 33 y.o. female.   HPI  Patient presents today with a complaint of left foot pain intermittently for more than 3 years. 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 She endorses a foot injury a few years ago in which she tripped over a toy. In review of EMR, imaging was completed of left foot and was negative for fracture or any other abnormalities. Pain is mostly localized to metatarsal joint region of left foot. She has not taken any over the counter medication for foot pain.   Mandy Wilson has a history of Bipolar disorder and is requesting to resume medications. Reports being off medication for several months. Per EMR, she was seen in ED 04/25/2018 for an adjustment disorder and alcohol intoxication. At the time of 04/25/2018 ED visit, patient reports that she was off bipolar medications at that time.  Past Medical History:  Diagnosis Date  . Bipolar 1 disorder (HCC)   . Depression   . Pregnancy     Patient Active Problem List   Diagnosis Date Noted  . 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 CESAREAN SECTION     X3    OB History    Gravida  1   Para      Term      Preterm      AB      Living        SAB      TAB      Ectopic      Multiple      Live Births               Home Medications    Prior to Admission medications   Medication Sig Start Date End Date Taking? Authorizing Provider  acetaminophen (TYLENOL) 500 MG tablet Take 1 tablet (500 mg total) by mouth every 6 (six) hours as needed. Patient not taking: Reported on 04/25/2018 03/24/18   Linus Mako B, NP  carbamazepine (TEGRETOL) 200 MG tablet Take 1  tablet (200 mg total) by mouth 2 (two) times daily. 04/26/18   Laveda Abbe, NP  guaiFENesin-codeine 100-10 MG/5ML syrup Take 5 mLs by mouth 3 (three) times daily as needed for cough. 12/02/18   Myra Rude, MD  ibuprofen (ADVIL,MOTRIN) 400 MG tablet Take 1 tablet (400 mg total) by mouth every 6 (six) hours as needed. Patient not taking: Reported on 04/25/2018 03/24/18   Linus Mako B, NP  ondansetron (ZOFRAN) 4 MG tablet Take 1 tablet (4 mg total) by mouth every 8 (eight) hours as needed for nausea or vomiting. Patient not taking: Reported on 04/25/2018 03/24/18   Georgetta Haber, NP    Family History Family History  Problem Relation Age of Onset  . Asthma Mother   . Asthma Father     Social History Social History   Tobacco Use  . Smoking status: Current Every Day Smoker  . Smokeless tobacco: Never Used  Substance Use Topics  . Alcohol use: Yes  . Drug use: Yes    Types: Marijuana     Allergies   Shellfish allergy   Review of  Systems Review of Systems Pertinent negatives listed in HPI Physical Exam Triage Vital Signs ED Triage Vitals  Enc Vitals Group     BP 01/02/19 0945 (!) 153/95     Pulse Rate 01/02/19 0945 90     Resp 01/02/19 0945 18     Temp 01/02/19 0945 97.9 F (36.6 C)     Temp Source 01/02/19 0945 Oral     SpO2 01/02/19 0945 99 %     Weight --      Height --      Head Circumference --      Peak Flow --      Pain Score 01/02/19 0948 7     Pain Loc --      Pain Edu? --      Excl. in GC? --    No data found.  Updated Vital Signs BP (!) 153/95   Pulse 90   Temp 97.9 F (36.6 C) (Oral)   Resp 18   LMP 11/17/2018 (Approximate)   SpO2 99%   Visual Acuity Right Eye Distance:   Left Eye Distance:   Bilateral Distance:    Right Eye Near:   Left Eye Near:    Bilateral Near:     Physical Exam General appearance: alert, well developed, well nourished, cooperative and in no distress Head: Normocephalic, without obvious abnormality,  atraumatic Respiratory: Respirations even and unlabored, normal respiratory rate Heart: rate and rhythm normal. No gallop or murmurs noted on exam  Extremities: left foot and ankle full ROM, no erythema or edema visible on examination. Skin: Skin color, texture, turgor normal. No rashes seen  Psych: Appropriate mood and affect. Neurologic: Mental status: Alert, oriented to person, place, and time, thought content appropriate. UC Treatments / Results  Labs (all labs ordered are listed, but only abnormal results are displayed) Labs Reviewed - No data to display  EKG None  Radiology No results found.  Procedures Procedures (including critical care time)  Medications Ordered in UC Medications - No data to display  Initial Impression / Assessment and Plan / UC Course  I have reviewed the triage vital signs and the nursing notes.  Pertinent labs & imaging results that were available during my care of the patient were reviewed by me and considered in my medical decision making (see chart for details).   Previous imaging of left foot as unremarkable. Suspect pain is related to foot strain given new position which requires prolonged standing. Recommend OTC shoe inserts. For inflammation, start  diclofenac 75 mg twice daily as needed for pain. Administered solumedrol 125 mg once daily. Patient verbalized understanding and agreement with plan.  Final Clinical Impressions(s) / UC Diagnoses   Final diagnoses:  Foot pain, left   Discharge Instructions   None    ED Prescriptions    Medication Sig Dispense Auth. Provider   diclofenac (VOLTAREN) 75 MG EC tablet Take 1 tablet (75 mg total) by mouth 2 (two) times daily. 30 tablet Bing NeighborsHarris, Anamaria Dusenbury S, FNP     Controlled Substance Prescriptions [place] Controlled Substance Registry consulted? Not Applicable   Bing NeighborsHarris, Kairon Shock S, FNP 01/04/19 1016

## 2019-01-02 NOTE — ED Notes (Signed)
Patient verbalizes understanding of discharge instructions. Opportunity for questioning and answers were provided. Patient discharged from [facility] by RN.  

## 2019-01-02 NOTE — ED Triage Notes (Signed)
Pt state left foot has been painful past 3 days, injury was a few years, also states she has not been able to take medication for bi polar and is depressed.

## 2019-01-18 ENCOUNTER — Encounter (HOSPITAL_COMMUNITY): Payer: Self-pay

## 2019-01-18 ENCOUNTER — Other Ambulatory Visit: Payer: Self-pay

## 2019-01-18 ENCOUNTER — Ambulatory Visit (HOSPITAL_COMMUNITY)
Admission: EM | Admit: 2019-01-18 | Discharge: 2019-01-18 | Disposition: A | Discharge status: Home / Self Care | Payer: Medicaid Other | Attending: Family Medicine | Admitting: Family Medicine

## 2019-01-18 DIAGNOSIS — J029 Acute pharyngitis, unspecified: Secondary | ICD-10-CM | POA: Insufficient documentation

## 2019-01-18 DIAGNOSIS — M79672 Pain in left foot: Secondary | ICD-10-CM | POA: Diagnosis present

## 2019-01-18 DIAGNOSIS — M545 Low back pain, unspecified: Secondary | ICD-10-CM

## 2019-01-18 DIAGNOSIS — G8929 Other chronic pain: Secondary | ICD-10-CM | POA: Insufficient documentation

## 2019-01-18 LAB — POCT RAPID STREP A: Streptococcus, Group A Screen (Direct): NEGATIVE

## 2019-01-18 MED ORDER — CYCLOBENZAPRINE HCL 5 MG PO TABS: 5.0000 mg | ORAL_TABLET | Freq: Three times a day (TID) | ORAL | 0 refills | Status: DC | PRN

## 2019-01-18 NOTE — Discharge Instructions (Signed)
Yours rapid strep test is negative.  We will let you know if the culture turns positive in a couple of days.  I think you have a viral illness.  You may go back to work on Tuesday.  If you feel worse instead of better, start having more fever or cough, please call follow-up advised. Continue taking Tylenol, or ibuprofen for your foot and back pain.  You may add the muscle relaxer cyclobenzaprine at night.  This will help with your back.  You need to have a primary care doctor for regular medical visits in order to get medicine refills.

## 2019-01-18 NOTE — ED Triage Notes (Addendum)
Pt cc left foot pain  X 3 days and sore throat, cough and back pain x 2 days. Pt states she needs work note.

## 2019-01-18 NOTE — ED Provider Notes (Signed)
[facility]    CSN: [id number] Arrival date & time: 01/18/19  1602     History   Chief Complaint Chief Complaint  Patient presents with  . Sore Throat  . Foot Pain    HPI Mandy Wilson is a 33 y.o. female.   HPI  Patient states she is had a sore throat for 2 days.  Low-grade fever.  She states she had some mild coughing that started today.  No shortness of breath.  No known exposure to illness.  She has children who are well at home.  He works in a [employer].  No known exposure to COVID-19. She has chronic left foot pain from an injury years ago.  She was just seen for this a couple weeks ago and tried on diclofenac.  We discussed that foot pain of this duration needs to be seen by specialist.  She needs to get a primary care doctor in order to get a referral.  Have given her the names of primary care doctors. She also complains of low back pain ever since she had a child 6 years ago.  She blames it on the epidural.  It is in her central low back.  It hurts when she is on her feet for long periods of time.  No radiation.  She is had no x-rays or work-up for this back pain.  She was given cyclobenzaprine at a visit last year for this, and it was helpful for her.  She states that her usual ibuprofen is not terribly helpful.  Past Medical History:  Diagnosis Date  . Bipolar 1 disorder (HCC)   . Depression   . Pregnancy     Patient Active Problem List   Diagnosis Date Noted  . 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 CESAREAN SECTION     X3    OB History    Gravida  1   Para      Term      Preterm      AB      Living        SAB      TAB      Ectopic      Multiple      Live Births               Home Medications    Prior to Admission medications   Medication Sig Start Date End Date Taking? Authorizing Provider  carbamazepine  (TEGRETOL) 200 MG tablet Take 1 tablet (200 mg total) by mouth 2 (two) times daily. 04/26/18   Laveda AbbeParks, Laurie Britton, NP  cyclobenzaprine (FLEXERIL) 5 MG tablet Take 1 tablet (5 mg total) by mouth 3 (three) times daily as needed for muscle spasms. 01/18/19   Eustace MooreNelson, Jaidah Lomax Sue, MD  diclofenac (VOLTAREN) 75 MG EC tablet Take 1 tablet (75 mg total) by mouth 2 (two) times daily. 01/02/19   Bing NeighborsHarris, Kimberly S, FNP    Family History Family History  Problem Relation Age of Onset  . Asthma Mother   . Asthma Father     Social History Social History   Tobacco Use  . Smoking status: Current Every Day Smoker  . Smokeless tobacco: Never Used  Substance Use Topics  . Alcohol use: Yes  . Drug use: Yes    Types: Marijuana     Allergies   Shellfish allergy   Review of Systems  Review of Systems  Constitutional: Negative for chills and fever.  HENT: Positive for sore throat. Negative for ear pain.   Eyes: Negative for pain and visual disturbance.  Respiratory: Negative for cough and shortness of breath.   Cardiovascular: Negative for chest pain and palpitations.  Gastrointestinal: Negative for abdominal pain and vomiting.  Genitourinary: Negative for dysuria and hematuria.  Musculoskeletal: Positive for back pain and gait problem. Negative for arthralgias.  Skin: Negative for color change and rash.  Neurological: Negative for seizures and syncope.  All other systems reviewed and are negative.    Physical Exam Triage Vital Signs ED Triage Vitals  Enc Vitals Group     BP 01/18/19 1614 (!) 119/92     Pulse Rate 01/18/19 1614 85     Resp 01/18/19 1614 16     Temp 01/18/19 1614 99.1 F (37.3 C)     Temp Source 01/18/19 1614 Oral     SpO2 01/18/19 1614 99 %     Weight 01/18/19 1611 120 lb (54.4 kg)   No data found.  Updated Vital Signs BP (!) 119/92 (BP Location: Right Arm)   Pulse 85   Temp 99.1 F (37.3 C) (Oral)   Resp 16   Wt 54.4 kg   LMP 10/19/2018   SpO2 99%   BMI  20.60 kg/m      Physical Exam Constitutional:      General: She is not in acute distress.    Appearance: She is well-developed and normal weight. She is not ill-appearing.  HENT:     Head: Normocephalic and atraumatic.     Right Ear: Tympanic membrane and ear canal normal.     Left Ear: Tympanic membrane and ear canal normal.     Nose: No congestion or rhinorrhea.     Mouth/Throat:     Mouth: Mucous membranes are moist.     Pharynx: Uvula midline. Posterior oropharyngeal erythema present.     Tonsils: No tonsillar exudate or tonsillar abscesses.  Eyes:     Conjunctiva/sclera: Conjunctivae normal.     Pupils: Pupils are equal, round, and reactive to light.  Neck:     Musculoskeletal: Normal range of motion.  Cardiovascular:     Rate and Rhythm: Normal rate and regular rhythm.  Pulmonary:     Effort: Pulmonary effort is normal. No respiratory distress.     Breath sounds: Normal breath sounds.  Abdominal:     General: There is no distension.     Palpations: Abdomen is soft.  Musculoskeletal: Normal range of motion.  Lymphadenopathy:     Cervical: Cervical adenopathy present.  Skin:    General: Skin is warm and dry.  Neurological:     Mental Status: She is alert.      UC Treatments / Results  Labs (all labs ordered are listed, but only abnormal results are displayed) Labs Reviewed  CULTURE, GROUP A STREP [facility])  POCT RAPID STREP A    EKG None  Radiology No results found.  Procedures Procedures (including critical care time)  Medications Ordered in UC Medications - No data to display  Initial Impression / Assessment and Plan / UC Course  I have reviewed the triage vital signs and the nursing notes.  Pertinent labs & imaging results that were available during my care of the patient were reviewed by me and considered in my medical decision making (see chart for details).     Patient has a viral upper respiratory infection.  She has mild symptoms  that are  nonsuggestive of COVID-19.  She is advised that if she gets worse instead of better she needs to call for advice regarding follow-up. She has chronic foot pain and chronic back pain.  These are better addressed by a primary care doctor.  I will give her 1 refill of cyclobenzaprine 5 mg to take as needed at night. Final Clinical Impressions(s) / UC Diagnoses   Final diagnoses:  Sore throat  Foot pain, left  Chronic midline low back pain without sciatica     Discharge Instructions     Yours rapid strep test is negative.  We will let you know if the culture turns positive in a couple of days.  I think you have a viral illness.  You may go back to work on Tuesday.  If you feel worse instead of better, start having more fever or cough, please call follow-up advised. Continue taking Tylenol, or ibuprofen for your foot and back pain.  You may add the muscle relaxer cyclobenzaprine at night.  This will help with your back.  You need to have a primary care doctor for regular medical visits in order to get medicine refills.   ED Prescriptions    Medication Sig Dispense Auth. Provider   cyclobenzaprine (FLEXERIL) 5 MG tablet Take 1 tablet (5 mg total) by mouth 3 (three) times daily as needed for muscle spasms. 30 tablet Eustace Moore, MD     Controlled Substance Prescriptions  Controlled Substance Registry consulted? Not Applicable   Eustace Moore, MD 01/18/19 [phone]

## 2019-01-21 LAB — CULTURE, GROUP A STREP (THRC)

## 2019-08-05 ENCOUNTER — Other Ambulatory Visit: Payer: Self-pay

## 2019-08-05 ENCOUNTER — Ambulatory Visit (INDEPENDENT_AMBULATORY_CARE_PROVIDER_SITE_OTHER): Payer: Medicaid Other

## 2019-08-05 ENCOUNTER — Encounter (HOSPITAL_COMMUNITY): Payer: Self-pay

## 2019-08-05 ENCOUNTER — Ambulatory Visit (HOSPITAL_COMMUNITY)
Admission: EM | Admit: 2019-08-05 | Discharge: 2019-08-05 | Disposition: A | Discharge status: Home / Self Care | Payer: Medicaid Other | Attending: Family Medicine | Admitting: Family Medicine

## 2019-08-05 DIAGNOSIS — M25441 Effusion, right hand: Secondary | ICD-10-CM | POA: Diagnosis not present

## 2019-08-05 DIAGNOSIS — S62652A Nondisplaced fracture of medial phalanx of right middle finger, initial encounter for closed fracture: Secondary | ICD-10-CM | POA: Diagnosis not present

## 2019-08-05 DIAGNOSIS — M79644 Pain in right finger(s): Secondary | ICD-10-CM

## 2019-08-05 MED ORDER — TRAMADOL HCL 50 MG PO TABS: 50.0000 mg | ORAL_TABLET | Freq: Three times a day (TID) | ORAL | 0 refills | Status: DC | PRN

## 2019-08-05 MED ORDER — KETOROLAC TROMETHAMINE 60 MG/2ML IM SOLN
60.0000 mg | Freq: Once | INTRAMUSCULAR | Status: AC
  Administered 2019-08-05: 60 mg via INTRAMUSCULAR

## 2019-08-05 MED ORDER — NAPROXEN 500 MG PO TABS: 500.0000 mg | ORAL_TABLET | Freq: Two times a day (BID) | ORAL | 0 refills | Status: DC

## 2019-08-05 MED ORDER — KETOROLAC TROMETHAMINE 60 MG/2ML IM SOLN
INTRAMUSCULAR | Status: AC
Start: 2019-08-05 — End: ?
  Filled 2019-08-05: qty 2
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
## 2019-08-05 NOTE — Discharge Instructions (Signed)
Please contact Dr. Vanetta Shawl office for a consult regarding your finger fracture.  In the meantime schedule naproxen, pain medication, twice a day.  If you still have pain despite taking naproxen consistently, then you could use tramadol once every 8 hours.  Stop taking tramadol as soon as your pain is controlled and keep taken naproxen.

## 2019-08-31 ENCOUNTER — Other Ambulatory Visit: Payer: Self-pay

## 2019-08-31 ENCOUNTER — Encounter (HOSPITAL_BASED_OUTPATIENT_CLINIC_OR_DEPARTMENT_OTHER): Payer: Self-pay | Admitting: *Deleted

## 2019-08-31 ENCOUNTER — Other Ambulatory Visit (HOSPITAL_COMMUNITY)
Admission: RE | Admit: 2019-08-31 | Discharge: 2019-08-31 | Disposition: A | Discharge status: Home / Self Care | Payer: Medicaid Other | Source: Ambulatory Visit | Attending: Orthopedic Surgery | Admitting: Orthopedic Surgery

## 2019-08-31 DIAGNOSIS — Z01812 Encounter for preprocedural laboratory examination: Secondary | ICD-10-CM | POA: Insufficient documentation

## 2019-08-31 DIAGNOSIS — Z20828 Contact with and (suspected) exposure to other viral communicable diseases: Secondary | ICD-10-CM | POA: Diagnosis not present

## 2019-08-31 LAB — SARS CORONAVIRUS 2 (TAT 6-24 HRS): SARS Coronavirus 2: NEGATIVE
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
## 2019-09-02 ENCOUNTER — Ambulatory Visit (HOSPITAL_BASED_OUTPATIENT_CLINIC_OR_DEPARTMENT_OTHER): Payer: Medicaid Other | Admitting: Certified Registered"

## 2019-09-02 ENCOUNTER — Encounter (HOSPITAL_BASED_OUTPATIENT_CLINIC_OR_DEPARTMENT_OTHER)
Admission: RE | Disposition: A | Discharge status: Home / Self Care | Payer: Self-pay | Source: Home / Self Care | Attending: Orthopedic Surgery

## 2019-09-02 ENCOUNTER — Ambulatory Visit (HOSPITAL_BASED_OUTPATIENT_CLINIC_OR_DEPARTMENT_OTHER)
Admission: RE | Admit: 2019-09-02 | Discharge: 2019-09-02 | Disposition: A | Discharge status: Home / Self Care | Payer: Medicaid Other | Attending: Orthopedic Surgery | Admitting: Orthopedic Surgery

## 2019-09-02 ENCOUNTER — Encounter (HOSPITAL_BASED_OUTPATIENT_CLINIC_OR_DEPARTMENT_OTHER): Payer: Self-pay

## 2019-09-02 ENCOUNTER — Other Ambulatory Visit: Payer: Self-pay

## 2019-09-02 DIAGNOSIS — S62602P Fracture of unspecified phalanx of right middle finger, subsequent encounter for fracture with malunion: Secondary | ICD-10-CM

## 2019-09-02 DIAGNOSIS — F319 Bipolar disorder, unspecified: Secondary | ICD-10-CM | POA: Diagnosis not present

## 2019-09-02 DIAGNOSIS — W228XXD Striking against or struck by other objects, subsequent encounter: Secondary | ICD-10-CM | POA: Diagnosis not present

## 2019-09-02 DIAGNOSIS — Z91013 Allergy to seafood: Secondary | ICD-10-CM | POA: Diagnosis not present

## 2019-09-02 DIAGNOSIS — F17210 Nicotine dependence, cigarettes, uncomplicated: Secondary | ICD-10-CM | POA: Insufficient documentation

## 2019-09-02 DIAGNOSIS — S62622P Displaced fracture of medial phalanx of right middle finger, subsequent encounter for fracture with malunion: Secondary | ICD-10-CM | POA: Insufficient documentation

## 2019-09-02 HISTORY — PX: OPEN REDUCTION INTERNAL FIXATION (ORIF) DISTAL PHALANX: SHX6236

## 2019-09-02 LAB — POCT PREGNANCY, URINE: Preg Test, Ur: NEGATIVE

## 2019-09-02 SURGERY — OPEN REDUCTION INTERNAL FIXATION (ORIF) DISTAL PHALANX
Anesthesia: General | Site: Finger | Laterality: Right

## 2019-09-02 MED ORDER — FENTANYL CITRATE (PF) 100 MCG/2ML IJ SOLN
INTRAMUSCULAR | Status: AC
  Filled 2019-09-02: qty 2

## 2019-09-02 MED ORDER — OXYCODONE HCL 5 MG/5ML PO SOLN: 5.0000 mg | Freq: Once | ORAL | Status: AC | PRN

## 2019-09-02 MED ORDER — LIDOCAINE HCL (CARDIAC) PF 100 MG/5ML IV SOSY
PREFILLED_SYRINGE | INTRAVENOUS | Status: DC | PRN
  Administered 2019-09-02: 50 mg via INTRAVENOUS

## 2019-09-02 MED ORDER — MIDAZOLAM HCL 2 MG/2ML IJ SOLN
INTRAMUSCULAR | Status: DC | PRN
  Administered 2019-09-02: 2 mg via INTRAVENOUS

## 2019-09-02 MED ORDER — FENTANYL CITRATE (PF) 100 MCG/2ML IJ SOLN
INTRAMUSCULAR | Status: DC | PRN
  Administered 2019-09-02: 50 ug via INTRAVENOUS
  Administered 2019-09-02: 100 ug via INTRAVENOUS

## 2019-09-02 MED ORDER — MIDAZOLAM HCL 2 MG/2ML IJ SOLN
INTRAMUSCULAR | Status: AC
  Filled 2019-09-02: qty 2

## 2019-09-02 MED ORDER — BUPIVACAINE HCL (PF) 0.25 % IJ SOLN
INTRAMUSCULAR | Status: DC | PRN
  Administered 2019-09-02: 7 mL

## 2019-09-02 MED ORDER — GLYCOPYRROLATE 0.2 MG/ML IJ SOLN
INTRAMUSCULAR | Status: DC | PRN
  Administered 2019-09-02: .2 mg via INTRAVENOUS

## 2019-09-02 MED ORDER — DEXAMETHASONE SODIUM PHOSPHATE 10 MG/ML IJ SOLN
INTRAMUSCULAR | Status: AC
  Filled 2019-09-02: qty 1

## 2019-09-02 MED ORDER — PROPOFOL 10 MG/ML IV BOLUS
INTRAVENOUS | Status: AC
  Filled 2019-09-02: qty 20

## 2019-09-02 MED ORDER — OXYCODONE HCL 5 MG PO TABS
5.0000 mg | ORAL_TABLET | Freq: Once | ORAL | Status: AC | PRN
  Administered 2019-09-02: 5 mg via ORAL

## 2019-09-02 MED ORDER — OXYCODONE HCL 5 MG PO TABS
ORAL_TABLET | ORAL | Status: AC
  Filled 2019-09-02: qty 1

## 2019-09-02 MED ORDER — LACTATED RINGERS IV SOLN
INTRAVENOUS | Status: DC
  Administered 2019-09-02: 13:00:00 via INTRAVENOUS

## 2019-09-02 MED ORDER — CEFAZOLIN SODIUM-DEXTROSE 2-4 GM/100ML-% IV SOLN
INTRAVENOUS | Status: AC
  Filled 2019-09-02: qty 100

## 2019-09-02 MED ORDER — PROPOFOL 10 MG/ML IV BOLUS
INTRAVENOUS | Status: DC | PRN
  Administered 2019-09-02: 200 mg via INTRAVENOUS

## 2019-09-02 MED ORDER — DEXAMETHASONE SODIUM PHOSPHATE 10 MG/ML IJ SOLN
INTRAMUSCULAR | Status: DC | PRN
  Administered 2019-09-02: 5 mg via INTRAVENOUS

## 2019-09-02 MED ORDER — FENTANYL CITRATE (PF) 100 MCG/2ML IJ SOLN
25.0000 ug | INTRAMUSCULAR | Status: DC | PRN
  Administered 2019-09-02 (×2): 50 ug via INTRAVENOUS

## 2019-09-02 MED ORDER — CEFAZOLIN SODIUM-DEXTROSE 2-3 GM-%(50ML) IV SOLR
INTRAVENOUS | Status: DC | PRN
  Administered 2019-09-02: 2 g via INTRAVENOUS

## 2019-09-02 MED ORDER — DEXMEDETOMIDINE HCL IN NACL 200 MCG/50ML IV SOLN
INTRAVENOUS | Status: DC | PRN
  Administered 2019-09-02: 12 ug via INTRAVENOUS

## 2019-09-02 MED ORDER — ONDANSETRON HCL 4 MG/2ML IJ SOLN
INTRAMUSCULAR | Status: AC
  Filled 2019-09-02: qty 2

## 2019-09-02 MED ORDER — CEFAZOLIN SODIUM-DEXTROSE 2-4 GM/100ML-% IV SOLN: 2.0000 g | INTRAVENOUS | Status: DC

## 2019-09-02 MED ORDER — KETOROLAC TROMETHAMINE 30 MG/ML IJ SOLN
30.0000 mg | Freq: Once | INTRAMUSCULAR | Status: AC
  Administered 2019-09-02: 30 mg via INTRAVENOUS

## 2019-09-02 MED ORDER — ACETAMINOPHEN 10 MG/ML IV SOLN
INTRAVENOUS | Status: AC
  Filled 2019-09-02: qty 100

## 2019-09-02 MED ORDER — ONDANSETRON HCL 4 MG/2ML IJ SOLN: 4.0000 mg | Freq: Once | INTRAMUSCULAR | Status: DC | PRN

## 2019-09-02 MED ORDER — KETOROLAC TROMETHAMINE 30 MG/ML IJ SOLN
INTRAMUSCULAR | Status: AC
  Filled 2019-09-02: qty 1

## 2019-09-02 MED ORDER — CHLORHEXIDINE GLUCONATE 4 % EX LIQD: 60.0000 mL | Freq: Once | CUTANEOUS | Status: DC

## 2019-09-02 MED ORDER — ACETAMINOPHEN 10 MG/ML IV SOLN
1000.0000 mg | Freq: Once | INTRAVENOUS | Status: AC
  Administered 2019-09-02: 1000 mg via INTRAVENOUS

## 2019-09-02 SURGICAL SUPPLY — 70 items
BENZOIN TINCTURE PRP APPL 2/3 (GAUZE/BANDAGES/DRESSINGS) IMPLANT
BIT DRILL 1.1 MINI QC NONSTRL (BIT) ×3 IMPLANT
BIT DRILL 1.3 (BIT) ×2
BIT DRILL 1.3XMNQK CNCT DISP (BIT) ×1 IMPLANT
BIT DRL 1.3XMNQK CNCT DISP (BIT) ×1
BLADE SURG 15 STRL LF DISP TIS (BLADE) ×2 IMPLANT
BLADE SURG 15 STRL SS (BLADE) ×4
BNDG COHESIVE 1X5 TAN STRL LF (GAUZE/BANDAGES/DRESSINGS) ×3 IMPLANT
BNDG ELASTIC 2X5.8 VLCR STR LF (GAUZE/BANDAGES/DRESSINGS) ×3 IMPLANT
BNDG ELASTIC 3X5.8 VLCR STR LF (GAUZE/BANDAGES/DRESSINGS) ×3 IMPLANT
BNDG ESMARK 4X9 LF (GAUZE/BANDAGES/DRESSINGS) ×3 IMPLANT
CANISTER SUCT 1200ML W/VALVE (MISCELLANEOUS) IMPLANT
CLOSURE WOUND 1/2 X4 (GAUZE/BANDAGES/DRESSINGS)
CORD BIPOLAR FORCEPS 12FT (ELECTRODE) ×3 IMPLANT
COVER BACK TABLE REUSABLE LG (DRAPES) ×3 IMPLANT
COVER WAND RF STERILE (DRAPES) IMPLANT
CUFF TOURN SGL QUICK 18X4 (TOURNIQUET CUFF) ×3 IMPLANT
DECANTER SPIKE VIAL GLASS SM (MISCELLANEOUS) IMPLANT
DRAPE EXTREMITY T 121X128X90 (DISPOSABLE) ×3 IMPLANT
DRAPE HALF SHEET 70X43 (DRAPES) ×3 IMPLANT
DRAPE OEC MINIVIEW 54X84 (DRAPES) ×3 IMPLANT
DRAPE SURG 17X23 STRL (DRAPES) ×3 IMPLANT
DRSG EMULSION OIL 3X3 NADH (GAUZE/BANDAGES/DRESSINGS) ×3 IMPLANT
GAUZE 4X4 16PLY RFD (DISPOSABLE) ×3 IMPLANT
GAUZE SPONGE 4X4 12PLY STRL LF (GAUZE/BANDAGES/DRESSINGS) ×3 IMPLANT
GLOVE BIO SURGEON STRL SZ 6.5 (GLOVE) ×2 IMPLANT
GLOVE BIO SURGEON STRL SZ8 (GLOVE) ×3 IMPLANT
GLOVE BIO SURGEONS STRL SZ 6.5 (GLOVE) ×1
GLOVE BIOGEL PI IND STRL 6.5 (GLOVE) ×1 IMPLANT
GLOVE BIOGEL PI IND STRL 7.0 (GLOVE) ×2 IMPLANT
GLOVE BIOGEL PI IND STRL 8.5 (GLOVE) ×1 IMPLANT
GLOVE BIOGEL PI INDICATOR 6.5 (GLOVE) ×2
GLOVE BIOGEL PI INDICATOR 7.0 (GLOVE) ×4
GLOVE BIOGEL PI INDICATOR 8.5 (GLOVE) ×2
GLOVE ECLIPSE 6.5 STRL STRAW (GLOVE) ×6 IMPLANT
GOWN STRL REUS W/ TWL LRG LVL3 (GOWN DISPOSABLE) ×1 IMPLANT
GOWN STRL REUS W/ TWL XL LVL3 (GOWN DISPOSABLE) ×1 IMPLANT
GOWN STRL REUS W/TWL LRG LVL3 (GOWN DISPOSABLE) ×2
GOWN STRL REUS W/TWL XL LVL3 (GOWN DISPOSABLE) ×2
NEEDLE HYPO 25X1 1.5 SAFETY (NEEDLE) ×3 IMPLANT
NS IRRIG 1000ML POUR BTL (IV SOLUTION) ×3 IMPLANT
PACK BASIN DAY SURGERY FS (CUSTOM PROCEDURE TRAY) ×3 IMPLANT
PAD CAST 3X4 CTTN HI CHSV (CAST SUPPLIES) ×1 IMPLANT
PADDING CAST ABS 3INX4YD NS (CAST SUPPLIES) ×2
PADDING CAST ABS COTTON 3X4 (CAST SUPPLIES) ×1 IMPLANT
PADDING CAST COTTON 3X4 STRL (CAST SUPPLIES) ×2
PADDING UNDERCAST 2 STRL (CAST SUPPLIES)
PADDING UNDERCAST 2X4 STRL (CAST SUPPLIES) IMPLANT
SCREW 1.3X10MM (Screw) ×2 IMPLANT
SCREW 1.3X11MM (Screw) ×2 IMPLANT
SCREW BN 10X1.3XNONLOCK HND (Screw) ×1 IMPLANT
SCREW BN 11X1.3XNONLOCK HND (Screw) ×1 IMPLANT
SPLINT FIBERGLASS 3X35 (CAST SUPPLIES) IMPLANT
SPLINT FINGER 3.25 911903 (SOFTGOODS) ×3 IMPLANT
STOCKINETTE 4X48 STRL (DRAPES) ×3 IMPLANT
STRIP CLOSURE SKIN 1/2X4 (GAUZE/BANDAGES/DRESSINGS) IMPLANT
SUCTION FRAZIER HANDLE 10FR (MISCELLANEOUS) ×2
SUCTION TUBE FRAZIER 10FR DISP (MISCELLANEOUS) ×1 IMPLANT
SUT MERSILENE 4 0 P 3 (SUTURE) ×3 IMPLANT
SUT MNCRL AB 3-0 PS2 18 (SUTURE) ×3 IMPLANT
SUT MON AB 4-0 PC3 18 (SUTURE) IMPLANT
SUT PROLENE 4 0 PS 2 18 (SUTURE) ×6 IMPLANT
SUT VIC AB 3-0 FS2 27 (SUTURE) IMPLANT
SYR BULB 3OZ (MISCELLANEOUS) ×3 IMPLANT
SYR CONTROL 10ML LL (SYRINGE) ×3 IMPLANT
TOWEL GREEN STERILE FF (TOWEL DISPOSABLE) ×6 IMPLANT
TRAY DSU PREP LF (CUSTOM PROCEDURE TRAY) ×3 IMPLANT
TUBE CONNECTING 20'X1/4 (TUBING)
TUBE CONNECTING 20X1/4 (TUBING) IMPLANT
UNDERPAD 30X36 HEAVY ABSORB (UNDERPADS AND DIAPERS) ×3 IMPLANT

## 2019-09-02 NOTE — Op Note (Signed)
PREOPERATIVE DIAGNOSIS: Right long finger middle phalanx malunion  POSTOPERATIVE DIAGNOSIS: Same  ATTENDING SURGEON: Dr. Iran Planas who scrubbed and present for the entire procedure  ASSISTANT SURGEON: Gertie Fey, PA-C was scrubbed and necessary for takedown of the malunion open reduction internal fixation closure and splinting in a timely fashion  ANESTHESIA: General via LMA  OPERATIVE PROCEDURE: #1: Repair right long finger middle phalanx malunion with internal fixation #2: Radiographs 3 views right long finger  IMPLANTS: Biomet hand Alps 1.3 millimeter screws x2  RADIOGRAPHIC INTERPRETATION: AP lateral and oblique views of the finger do show the screw fixation with good alignment of the middle phalanx  SURGICAL INDICATIONS: Patient is a right-hand-dominant female who sustained the injury greater than 4 weeks ago to the right long finger.  Patient presented with the worsening deformity and malunion.  Is recommended the patient undergo the above procedure.  The risks of surgery include but not limited to bleeding infection damage nearby nerves arteries or tendons loss of motion of the wrist or digits incomplete relief of symptoms and need for further surgical intervention  SURGICAL TECHNIQUE: Patient was palpated find the preoperative holding area marked the permanent marker made on the right long finger to indicate the correct operative site.  Patient brought back operating placed supine on the anesthesia table where the general anesthetic was administered.  Patient tolerated well.  A well-padded tourniquet placed on the right brachium seal with the appropriate drape.  Right upper extremities then prepped and draped normal sterile fashion.  A timeout was called the correct site was identified procedure then begun.  Attention turned to the right long finger longitudinal incision made directly over the middle phalanx.  Tourniquet insufflated.  Dissection carried down through the skin and  subcutaneous tissue.  The extensor interval was then incised longitudinally exposing the middle phalanx.  Careful protection of the proximal portion of the middle phalanx to protect the central slip was done.  The malunion was then carefully identified and takedown of the malunion was then done.  Small instrumentation was used to take down the malunion recreating the old fracture plane and fracture site.  Small curettes and rongeurs were then used to remove the new bone.  Once takedown of the malunion was then completed reduction clamp was then applied.  Confirmation of the reduction was then done using the mini C arm.  Following this two 1.3 millimeter screws were then placed across the obliquity of the fracture perpendicular to the fracture in slightly different planes over drilling the near cortex with a 1.3 mm drill bit the far cortex of the 1 1 drill bit so total of 2 lag screws.  The wound was thoroughly irrigated.  Final radiographs were then obtained.  The extensor interval was then closed with 4-0 Mersilene suture.  Skin was then closed using simple Prolene suture.  Adaptic dressing a sterile compressive bandage applied.  The patient is and placed in a finger splint extubated taken recovery in good condition.  POSTOPERATIVE PLAN: Patient be followed up in the office in 9 to 10 days.  Patient will be seen back for radiographs likely suture removal.  We will coordinate her getting into therapy.  We will give her splint and then have her see our therapist at the first postoperative visit.  Radiographs at each visit.  Begin some gentle active range of motion at the first postoperative visit.

## 2019-09-02 NOTE — Anesthesia Procedure Notes (Signed)
Procedure Name: LMA Insertion Performed by: Xzandria Clevinger M, CRNA Pre-anesthesia Checklist: Patient identified, Emergency Drugs available, Suction available and Patient being monitored Patient Re-evaluated:Patient Re-evaluated prior to induction Oxygen Delivery Method: Circle system utilized Preoxygenation: Pre-oxygenation with 100% oxygen Induction Type: IV induction Ventilation: Mask ventilation without difficulty LMA: LMA inserted LMA Size: 3.0 Number of attempts: 1 Placement Confirmation: positive ETCO2 Tube secured with: Tape Dental Injury: Teeth and Oropharynx as per pre-operative assessment        

## 2019-09-02 NOTE — Transfer of Care (Signed)
Immediate Anesthesia Transfer of Care Note  Patient: [name]  Procedure(s) Performed: OPEN REDUCTION INTERNAL FIXATION (ORIF) right long finger malunion repair (Right Finger)  Patient Location: PACU  Anesthesia Type:General  Level of Consciousness: awake, alert  and oriented  Airway & Oxygen Therapy: Patient Spontanous Breathing and Patient connected to face mask oxygen  Post-op Assessment: Report given to RN and Post -op Vital signs reviewed and stable  Post vital signs: Reviewed and stable  Last Vitals:  Vitals Value Taken Time  BP    Temp    Pulse 65 09/02/19 1529  Resp    SpO2 100 % 09/02/19 1529  Vitals shown include unvalidated device data.  Last Pain:  Vitals:   09/02/19 1300  TempSrc: Tympanic  PainSc: 6       Patients Stated Pain Goal: 6 (83/09/40 7680)  Complications: No apparent anesthesia complications

## 2019-09-02 NOTE — Discharge Instructions (Signed)
KEEP BANDAGE CLEAN AND DRY CALL OFFICE FOR F/U APPT [phone] IN 9 DAYS WITH BARTON PA-C KEEP HAND ELEVATED ABOVE HEART OK TO APPLY ICE TO OPERATIVE AREA CONTACT OFFICE IF ANY WORSENING PAIN OR CONCERNS.   YOU RECEIVED TYLENOL 1000 MG AND TORADOL 30 MG IN YOUR IV AT 4:00 PM. YOU MAY TAKE TYLENOL OR IBUPROFEN (ADVIL, MOTRIN ETC) ANY TIME AFTER 10:00 PM IF NEEDED. YOU HAD AN OXYCODONE 5 MG ORALLY AT 5:10 PM YOU CAN HAVE ANOTHER PAIN PILL AS PER ORDER ON YOUR BOTTLE OF HYDROCODONE (USUALLY EVERY 6 HOURS)  Call your surgeon if you experience:   1.  Fever over 101.0. 2.  Inability to urinate. 3.  Nausea and/or vomiting. 4.  Extreme swelling or bruising at the surgical site. 5.  Continued bleeding from the incision. 6.  Increased pain, redness or drainage from the incision. 7.  Problems related to your pain medication. 8.  Any problems and/or concerns    Post Anesthesia Home Care Instructions  Activity: Get plenty of rest for the remainder of the day. A responsible individual must stay with you for 24 hours following the procedure.  For the next 24 hours, DO NOT: -Drive a car -Paediatric nurse -Drink alcoholic beverages -Take any medication unless instructed by your physician -Make any legal decisions or sign important papers.  Meals: Start with liquid foods such as gelatin or soup. Progress to regular foods as tolerated. Avoid greasy, spicy, heavy foods. If nausea and/or vomiting occur, drink only clear liquids until the nausea and/or vomiting subsides. Call your physician if vomiting continues.  Special Instructions/Symptoms: Your throat may feel dry or sore from the anesthesia or the breathing tube placed in your throat during surgery. If this causes discomfort, gargle with warm salt water. The discomfort should disappear within 24 hours.  If you had a scopolamine patch placed behind your ear for the management of post- operative nausea and/or vomiting:  1. The medication in the  patch is effective for 72 hours, after which it should be removed.  Wrap patch in a tissue and discard in the trash. Wash hands thoroughly with soap and water. 2. You may remove the patch earlier than 72 hours if you experience unpleasant side effects which may include dry mouth, dizziness or visual disturbances. 3. Avoid touching the patch. Wash your hands with soap and water after contact with the patch.

## 2019-09-02 NOTE — Anesthesia Preprocedure Evaluation (Signed)
Anesthesia Evaluation  Patient identified by MRN, date of birth, ID band Patient awake    Reviewed: Allergy & Precautions, NPO status , Patient's Chart, lab work & pertinent test results  History of Anesthesia Complications Negative for: history of anesthetic complications  Airway Mallampati: II  TM Distance: >3 FB Neck ROM: Full    Dental  (+) Teeth Intact   Pulmonary neg pulmonary ROS, Current Smoker and Patient abstained from smoking.,    Pulmonary exam normal        Cardiovascular negative cardio ROS Normal cardiovascular exam     Neuro/Psych negative neurological ROS  negative psych ROS   GI/Hepatic negative GI ROS, Neg liver ROS,   Endo/Other  negative endocrine ROS  Renal/GU negative Renal ROS  negative genitourinary   Musculoskeletal negative musculoskeletal ROS (+)   Abdominal   Peds  Hematology negative hematology ROS (+)   Anesthesia Other Findings   Reproductive/Obstetrics                             Anesthesia Physical Anesthesia Plan  ASA: II  Anesthesia Plan: General   Post-op Pain Management:    Induction: Intravenous  PONV Risk Score and Plan: 2 and Ondansetron, Dexamethasone, Treatment may vary due to age or medical condition and Midazolam  Airway Management Planned: LMA  Additional Equipment: None  Intra-op Plan:   Post-operative Plan: Extubation in OR  Informed Consent: I have reviewed the patients History and Physical, chart, labs and discussed the procedure including the risks, benefits and alternatives for the proposed anesthesia with the patient or authorized representative who has indicated his/her understanding and acceptance.     Dental advisory given  Plan Discussed with:   Anesthesia Plan Comments:         Anesthesia Quick Evaluation

## 2019-09-02 NOTE — Anesthesia Postprocedure Evaluation (Signed)
Anesthesia Post Note  Patient: Mandy Wilson  Procedure(s) Performed: OPEN REDUCTION INTERNAL FIXATION (ORIF) right long finger malunion repair (Right Finger)     Patient location during evaluation: PACU Anesthesia Type: General Level of consciousness: awake and alert Pain management: pain level controlled Vital Signs Assessment: post-procedure vital signs reviewed and stable Respiratory status: spontaneous breathing, nonlabored ventilation and respiratory function stable Cardiovascular status: blood pressure returned to baseline and stable Postop Assessment: no apparent nausea or vomiting Anesthetic complications: no    Last Vitals:  Vitals:   09/02/19 1615 09/02/19 1630  BP: (!) 141/96 (!) 131/93  Pulse: 65 62  Resp: 14 (!) 23  Temp:    SpO2: 100% 97%    Last Pain:  Vitals:   09/02/19 1615  TempSrc:   PainSc: Asleep                 Lidia Collum

## 2019-09-03 ENCOUNTER — Encounter: Payer: Self-pay | Admitting: *Deleted

## 2019-11-03 ENCOUNTER — Ambulatory Visit (HOSPITAL_COMMUNITY)
Admission: EM | Admit: 2019-11-03 | Discharge: 2019-11-03 | Disposition: A | Discharge status: Home / Self Care | Payer: Medicaid Other | Attending: Urgent Care | Admitting: Urgent Care

## 2019-11-03 ENCOUNTER — Other Ambulatory Visit: Payer: Self-pay

## 2019-11-03 ENCOUNTER — Encounter (HOSPITAL_COMMUNITY): Payer: Self-pay

## 2019-11-03 DIAGNOSIS — M545 Low back pain, unspecified: Secondary | ICD-10-CM

## 2019-11-03 DIAGNOSIS — M546 Pain in thoracic spine: Secondary | ICD-10-CM

## 2019-11-03 DIAGNOSIS — R110 Nausea: Secondary | ICD-10-CM

## 2019-11-03 DIAGNOSIS — M542 Cervicalgia: Secondary | ICD-10-CM

## 2019-11-03 MED ORDER — NAPROXEN 500 MG PO TABS: 500.0000 mg | ORAL_TABLET | Freq: Two times a day (BID) | ORAL | 0 refills | Status: DC

## 2019-11-03 MED ORDER — TIZANIDINE HCL 4 MG PO TABS: 4.0000 mg | ORAL_TABLET | Freq: Every evening | ORAL | 0 refills | Status: DC | PRN

## 2019-11-03 MED ORDER — ONDANSETRON 8 MG PO TBDP: 8.0000 mg | ORAL_TABLET | Freq: Three times a day (TID) | ORAL | 0 refills | Status: DC | PRN
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
## 2019-12-22 ENCOUNTER — Emergency Department (HOSPITAL_COMMUNITY)
Admission: EM | Admit: 2019-12-22 | Discharge: 2019-12-22 | Discharge status: Against Medical Advice | Payer: Medicaid Other

## 2019-12-22 ENCOUNTER — Other Ambulatory Visit: Payer: Self-pay

## 2019-12-22 NOTE — ED Notes (Signed)
Called pt last name 3 times with no response. Pt moved off the floor.

## 2019-12-23 ENCOUNTER — Encounter (HOSPITAL_COMMUNITY): Payer: Self-pay | Admitting: Emergency Medicine

## 2019-12-23 ENCOUNTER — Emergency Department (HOSPITAL_COMMUNITY)
Admission: EM | Admit: 2019-12-23 | Discharge: 2019-12-23 | Disposition: A | Discharge status: Home / Self Care | Payer: Medicaid Other | Attending: Emergency Medicine | Admitting: Emergency Medicine

## 2019-12-23 DIAGNOSIS — N760 Acute vaginitis: Secondary | ICD-10-CM | POA: Insufficient documentation

## 2019-12-23 DIAGNOSIS — B373 Candidiasis of vulva and vagina: Secondary | ICD-10-CM

## 2019-12-23 DIAGNOSIS — K6289 Other specified diseases of anus and rectum: Secondary | ICD-10-CM | POA: Diagnosis present

## 2019-12-23 DIAGNOSIS — B3731 Acute candidiasis of vulva and vagina: Secondary | ICD-10-CM

## 2019-12-23 DIAGNOSIS — K644 Residual hemorrhoidal skin tags: Secondary | ICD-10-CM

## 2019-12-23 DIAGNOSIS — F17210 Nicotine dependence, cigarettes, uncomplicated: Secondary | ICD-10-CM | POA: Diagnosis not present

## 2019-12-23 DIAGNOSIS — Z79899 Other long term (current) drug therapy: Secondary | ICD-10-CM | POA: Diagnosis not present

## 2019-12-23 DIAGNOSIS — B9689 Other specified bacterial agents as the cause of diseases classified elsewhere: Secondary | ICD-10-CM

## 2019-12-23 LAB — URINALYSIS, ROUTINE W REFLEX MICROSCOPIC
Bacteria, UA: NONE SEEN
Bilirubin Urine: NEGATIVE
Glucose, UA: NEGATIVE mg/dL
Hgb urine dipstick: NEGATIVE
Ketones, ur: 5 mg/dL — AB
Leukocytes,Ua: NEGATIVE
Nitrite: NEGATIVE
Protein, ur: 30 mg/dL — AB
Specific Gravity, Urine: 1.035 — ABNORMAL HIGH (ref 1.005–1.030)
pH: 5 (ref 5.0–8.0)

## 2019-12-23 LAB — POC URINE PREG, ED: Preg Test, Ur: NEGATIVE

## 2019-12-23 LAB — WET PREP, GENITAL
Sperm: NONE SEEN
Trich, Wet Prep: NONE SEEN

## 2019-12-23 MED ORDER — METRONIDAZOLE 500 MG PO TABS: 500.0000 mg | ORAL_TABLET | Freq: Two times a day (BID) | ORAL | 0 refills | Status: DC

## 2019-12-23 MED ORDER — PHENYLEPHRINE IN HARD FAT 0.25 % RE SUPP: 1.0000 | Freq: Two times a day (BID) | RECTAL | 2 refills | Status: DC

## 2019-12-23 MED ORDER — ACETAMINOPHEN 500 MG PO TABS: 500.0000 mg | ORAL_TABLET | Freq: Four times a day (QID) | ORAL | 0 refills | Status: DC | PRN

## 2019-12-23 MED ORDER — METRONIDAZOLE 500 MG PO TABS
500.0000 mg | ORAL_TABLET | Freq: Once | ORAL | Status: AC
  Administered 2019-12-23: 500 mg via ORAL
  Filled 2019-12-23: qty 1

## 2019-12-23 MED ORDER — BENZOCAINE 20 % RE OINT: TOPICAL_OINTMENT | RECTAL | 2 refills | Status: DC | PRN

## 2019-12-23 MED ORDER — DOCUSATE SODIUM 100 MG PO CAPS: 100.0000 mg | ORAL_CAPSULE | Freq: Two times a day (BID) | ORAL | 2 refills | Status: DC

## 2019-12-23 MED ORDER — FLUCONAZOLE 150 MG PO TABS
150.0000 mg | ORAL_TABLET | Freq: Once | ORAL | Status: AC
  Administered 2019-12-23: 150 mg via ORAL
  Filled 2019-12-23: qty 1

## 2019-12-23 NOTE — ED Triage Notes (Signed)
Pt here with c/o pain from known hemorrhoids , no bleeding noted just pain , pt has tried several otc meds without relief

## 2019-12-23 NOTE — Discharge Instructions (Signed)
Your work-up today showed evidence of a yeast infection which was treated with Diflucan here in the emergency department.  It also showed evidence of bacterial vaginosis.  This is not an STD.  This is treated with antibiotics.  Please take all of your antibiotics until finished!   Take your antibiotics with food.  Common side effects of antibiotics include nausea, vomiting, abdominal discomfort, and diarrhea. You may help offset some of this with probiotics which you can buy or get in yogurt. Do not eat  or take the probiotics until 2 hours after your antibiotic.    Some studies suggest that certain antibiotics can reduce the efficacy of certain oral contraceptive pills (birth control), so please use additional contraceptives (condoms or other barrier method) while you are taking the antibiotics and for an additional 5 to 7 days afterwards if you are a female on these medications.  Continue using sitz bath's. Start taking the docusate sodium stool softener orally and stop using the suppository. Continue using MiraLAX and Metamucil. Drink plenty of water and get rest. In addition to the naproxen that you have been taking twice daily you can take 1 to 2 tablets of extra strength Tylenol every 6 hours.  I would recommend using the phenylephrine suppositories as needed up to 4 times daily.  Use the benzocaine ointment up to every 3 hours as needed for pain.  This is a numbing cream.  Call the general surgeon's office today to schedule follow-up appointment for sometime this week or next week.  Let them know you were seen in the emergency department.  Return to the emergency department if any concerning signs or symptoms develop such as fevers, inability to move stool, vomiting, or severe uncontrolled pain.

## 2019-12-23 NOTE — ED Provider Notes (Signed)
[facility] EMERGENCY DEPARTMENT Provider Note   CSN: [id number] Arrival date & time: 12/23/19  1122     History No chief complaint on file.   Mandy Wilson is a 34 y.o. female with history of bipolar 1 disorder, depression presents for evaluation of acute onset, progressively worsening rectal pain for 2 weeks.  She reports she has known hemorrhoids that will occasionally "come out" but she has never had issues with them for this long.  She reports a constant sharp pressure near her rectum and pelvis which worsens with certain movements and attempts to have a bowel movement.  Also worsens with urinating.  She does note a little bit of dysuria but denies hematuria urgency or frequency.  She thinks she may have potentially given herself a yeast infection after using topical creams for her hemorrhoid.  She has no concern for STDs.  She has been using sitz bath's, Metamucil, stool softener, over-the-counter cream such as Preparation H for her hemorrhoids with little relief.  She reports that over the last week she has developed lower abdominal pressure.  She notes nausea and early satiety but denies fevers, vomiting.  Her last bowel movement was this morning and was soft.  She will occasionally notice a small amount of blood mixed in her stool.  The history is provided by the patient.       Past Medical History:  Diagnosis Date  . Bipolar 1 disorder (HCC)   . Depression   . Pregnancy     Patient Active Problem List   Diagnosis Date Noted  . 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 CESAREAN SECTION     X3  . OPEN REDUCTION INTERNAL FIXATION (ORIF) DISTAL PHALANX Right 09/02/2019   Procedure: OPEN REDUCTION INTERNAL FIXATION (ORIF) right long finger malunion repair;  Surgeon: Bradly Bienenstock, MD;  Location: [facility];  Service: Orthopedics;  Laterality: Right;      OB History    Gravida  1   Para      Term      Preterm      AB      Living        SAB      TAB      Ectopic      Multiple      Live Births              Family History  Problem Relation Age of Onset  . Asthma Mother   . Asthma Father     Social History   Tobacco Use  . Smoking status: Current Every Day Smoker    Types: Cigarettes  . Smokeless tobacco: Never Used  Substance Use Topics  . Alcohol use: Yes  . Drug use: Yes    Types: Marijuana    Home Medications Prior to Admission medications   Medication Sig Start Date End Date Taking? Authorizing Provider  acetaminophen (TYLENOL) 500 MG tablet Take 1 tablet (500 mg total) by mouth every 6 (six) hours as needed. 12/23/19   Denny Lave A, PA-C  benzocaine (AMERICAINE) 20 % rectal ointment Place rectally every 3 (three) hours as needed for pain. 12/23/19   Luevenia Maxin, Kizzie Cotten A, PA-C  docusate sodium (COLACE) 100 MG capsule Take 1 capsule (100 mg total) by mouth every 12 (twelve) hours. 12/23/19   Michela Pitcher A, PA-C  levonorgestrel (MIRENA) 20 MCG/24HR IUD 1 each by Intrauterine  route once.    Alver Fisher, RN  metroNIDAZOLE (FLAGYL) 500 MG tablet Take 1 tablet (500 mg total) by mouth 2 (two) times daily. 12/23/19   Latalia Etzler A, PA-C  naproxen (NAPROSYN) 500 MG tablet Take 1 tablet (500 mg total) by mouth 2 (two) times daily. 11/03/19   Wallis Bamberg, PA-C  ondansetron (ZOFRAN-ODT) 8 MG disintegrating tablet Take 1 tablet (8 mg total) by mouth every 8 (eight) hours as needed for nausea or vomiting. 11/03/19   Wallis Bamberg, PA-C  phenylephrine (,USE FOR PREPARATION-H,) 0.25 % suppository Place 1 suppository rectally 2 (two) times daily. 12/23/19   Gertrude Tarbet A, PA-C  QUEtiapine (SEROQUEL) 50 MG tablet Take 50 mg by mouth 3 (three) times daily.    Alver Fisher, RN  tiZANidine (ZANAFLEX) 4 MG tablet Take 1 tablet (4 mg total) by mouth at bedtime as needed for muscle spasms. 11/03/19   Wallis Bamberg, PA-C    Allergies     Shellfish allergy  Review of Systems   Review of Systems  Constitutional: Negative for chills and fever.  Gastrointestinal: Positive for blood in stool, nausea and rectal pain. Negative for vomiting.  Genitourinary: Positive for dysuria, pelvic pain and vaginal discharge. Negative for frequency, urgency, vaginal bleeding and vaginal pain.  All other systems reviewed and are negative.   Physical Exam Updated Vital Signs BP 97/64 (BP Location: Right Arm)   Pulse 91   Temp 98.4 F (36.9 C) (Oral)   Resp 18   SpO2 98%   Physical Exam Vitals and nursing note reviewed.  Constitutional:      General: She is not in acute distress.    Appearance: She is well-developed.  HENT:     Head: Normocephalic and atraumatic.  Eyes:     General:        Right eye: No discharge.        Left eye: No discharge.     Conjunctiva/sclera: Conjunctivae normal.  Neck:     Vascular: No JVD.     Trachea: No tracheal deviation.  Cardiovascular:     Rate and Rhythm: Normal rate and regular rhythm.  Pulmonary:     Effort: Pulmonary effort is normal.     Breath sounds: Normal breath sounds.  Abdominal:     General: Abdomen is flat. Bowel sounds are increased. There is no distension.     Palpations: Abdomen is soft.     Tenderness: There is abdominal tenderness in the right lower quadrant, suprapubic area and left lower quadrant. There is no right CVA tenderness, left CVA tenderness, guarding or rebound.  Genitourinary:    Comments: Examination performed in the presence of a chaperone.  No masses or lesions to the external genitalia.  The patient has 3 external hemorrhoids which are nonthrombosed nor actively bleeding.  No tenderness to palpation.  No anal fissures or tears.  No fluctuance erythema or induration noted.   Moderate amount of thick white discharge in the vaginal vault.  No rashes or lesions to the external genitalia. Cervix does not appear friable Musculoskeletal:     Cervical back: Neck  supple.  Skin:    General: Skin is warm and dry.     Findings: No erythema.  Neurological:     Mental Status: She is alert.  Psychiatric:        Behavior: Behavior normal.     ED Results / Procedures / Treatments   Labs (all labs ordered are listed, but only abnormal results are  displayed) Labs Reviewed  WET PREP, GENITAL - Abnormal; Notable for the following components:      Result Value   Yeast Wet Prep HPF POC PRESENT (*)    Clue Cells Wet Prep HPF POC PRESENT (*)    WBC, Wet Prep HPF POC MODERATE (*)    All other components within normal limits  URINALYSIS, ROUTINE W REFLEX MICROSCOPIC - Abnormal; Notable for the following components:   APPearance HAZY (*)    Specific Gravity, Urine 1.035 (*)    Ketones, ur 5 (*)    Protein, ur 30 (*)    All other components within normal limits  POC URINE PREG, ED    EKG None  Radiology No results found.  Procedures Procedures (including critical care time)  Medications Ordered in ED Medications  metroNIDAZOLE (FLAGYL) tablet 500 mg (has no administration in time range)  fluconazole (DIFLUCAN) tablet 150 mg (has no administration in time range)    ED Course  I have reviewed the triage vital signs and the nursing notes.  Pertinent labs & imaging results that were available during my care of the patient were reviewed by me and considered in my medical decision making (see chart for details).    MDM Rules/Calculators/A&P                      Patient presenting for evaluation of rectal pain from hemorrhoids.  She is afebrile, vital signs are stable.  Nontoxic in appearance.  She also notes some vaginal irritation and discharge.  She is not concerned for STDs and declines STD testing today.  Physical examination is reassuring and abdomen is soft with no rebound or guarding.  I doubt acute surgical abdominal pathology.  Rectal examination shows 3 nonthrombosed nontender hemorrhoids, no concern for perianal or perirectal abscess.   Pelvic examination is not concerning for PID, TOA or ovarian torsion.  Her wet prep shows evidence of yeast and BV so we will treat for both.  Her UA is not concerning for UTI or nephrolithiasis.   CONSULT: Spoke with Leary Roca with general surgery team.  He recommends that she follow-up with the general surgeons on an outpatient basis for reevaluation but no indication for any emergent evaluation and I agree with this.  Will discharge with benzocaine ointment and phenylephrine suppositories.  She will be discharged with a course of Flagyl for treatment of her BV.  We will switch her from a stool softening suppository to tablet.  Recommend follow-up with general surgery on an outpatient basis.  Discussed strict ED return precautions. Patient verbalized understanding of and agreement with plan and is safe for discharge home at this time.   Final Clinical Impression(s) / ED Diagnoses Final diagnoses:  External hemorrhoids  BV (bacterial vaginosis)  Yeast vaginitis    Rx / DC Orders ED Discharge Orders         Ordered    phenylephrine (,USE FOR PREPARATION-H,) 0.25 % suppository  2 times daily     12/23/19 1416    benzocaine (AMERICAINE) 20 % rectal ointment  Every  3 hours PRN     12/23/19 1416    acetaminophen (TYLENOL) 500 MG tablet  Every 6 hours PRN     12/23/19 1416    metroNIDAZOLE (FLAGYL) 500 MG tablet  2 times daily     12/23/19 1416    docusate sodium (COLACE) 100 MG capsule  Every 12 hours     12/23/19 1416  Renita Papa, PA-C 12/23/19 [place], MD 12/24/19 [phone]

## 2019-12-23 NOTE — ED Notes (Signed)
Patient verbalizes understanding of discharge instructions. Opportunity for questioning and answers were provided. Armband removed by staff, pt discharged from ED.  

## 2020-02-05 ENCOUNTER — Ambulatory Visit: Payer: Self-pay | Admitting: General Surgery

## 2020-02-05 NOTE — H&P (Signed)
History of Present Illness Mandy Levee MD; 02/05/2020 2:15 PM) The patient is a 34 year old female who presents with hemorrhoids. 34 year old female who presents to the office for evaluation of anal pain. She reports over the past 3-4 weeks, she has had increase in anal pain. She reports regular bowel habits up until this most recent episode. She has tried multiple over-the-counter remedies including fiber supplementation and laxatives. She continues to have sharp pain with bowel movements. She describes prolapsing tissue with bowel movements or increased abdominal pressure. She is currently being treated for BV. She complains of pelvic pain as well. She was seen approximately 2 months ago. We started her on diltiazem ointment. She felt like this was too irritating and therefore was switched to nifedipine ointment. She has been on these 2 appointments for the past month. She reports a slight decrease in her symptoms, but continues to have significant bleeding and pain with bowel movements.    Problem List/Past Medical Mandy Levee, MD; 02/05/2020 2:15 PM) Mandy Wilson FISSURE (K60.2)  Past Surgical History Mandy Levee, MD; 02/05/2020 2:15 PM) Cesarean Section - Multiple Oral Surgery  Diagnostic Studies History Mandy Levee, MD; 02/05/2020 2:15 PM) Colonoscopy never Mammogram 1-3 years ago Pap Smear 1-5 years ago  Allergies (April Staton, CMA; 02/05/2020 2:02 PM) Shellfish Hives.  Medication History (April Staton, CMA; 02/05/2020 2:02 PM) Lidocaine (5% Ointment, 1 (one) External as needed, Taken starting 01/22/2020) Active. Tylenol (500MG  Capsule, Oral) Active. Americaine (20% Ointment, Rectal) Active. Colace (100MG  Capsule, Oral) Active. Mirena (52 MG) (20MCG/24HR IUD, Intrauterine) Active. Naproxen (500MG  Tablet, Oral) Active. Zofran ODT (8MG  Tablet Disint, Oral) Active. SEROquel (50MG  Tablet, Oral) Active. Zanaflex (4MG  Capsule, Oral) Active. Flagyl (500MG   Tablet, Oral) Active. Medications Reconciled  Social History , MD; 02/05/2020 2:15 PM) Alcohol use Occasional alcohol use. Caffeine use Carbonated beverages, Coffee, Tea. Illicit drug use Uses socially only. Tobacco use Current every day smoker.  Family History , MD; 02/05/2020 2:15 PM) Alcohol Abuse Mother. Colon Cancer Father. Depression Mother. Hypertension Mother. Migraine Headache Mother.  Pregnancy / Birth History , MD; 02/05/2020 2:15 PM) Age at menarche 15 years. Contraceptive History Depo-provera, Intrauterine device, Oral contraceptives. Gravida 6 Irregular periods Length (months) of breastfeeding 12-24 Maternal age 29-20 Para 4  Other Problems 02/07/2020, MD; 02/05/2020 2:15 PM) Anxiety Disorder Back Pain Bladder Problems Depression Gastroesophageal Reflux Disease Hemorrhoids Migraine Headache    Vitals (April Staton CMA; 02/05/2020 2:02 PM) 02/05/2020 2:02 PM Weight: 114 lb Height: 64in Body Surface Area: 1.54 m Body Mass Index: 19.57 kg/m  Temp.: 98.18F(Tympanic)  Pulse: 96 (Regular)  P.OX: 98% (Room air) BP: 100/80(Sitting, Left Arm, Standard)        Physical Exam [birth year]1992 MD; 02/05/2020 2:15 PM)  General Mental Status-Alert. General Appearance-Cooperative. CV: RRR Lungs: CTA Abdomen Palpation/Percussion Palpation and Percussion of the abdomen reveal - Soft and Non Tender.  Rectal Anorectal Exam External - anal fissure and external hemorrhoids.    Assessment & Plan Mandy Levee MD; 02/05/2020 2:14 PM)  ANAL FISSURE (K60.2) Impression: 34 year old female who complains of consistent anal pain. She has been on topical treatment for an anal fissure for approximately one month now. She continues to have pain and bleeding with bowel movements. On exam today, she has an edematous right posterior hemorrhoid and I am unable to tell if her anal fissure has  healed. Given her persistent symptoms, I think it is reasonable to proceed with anal exam under anesthesia. If a persistent fissure is noted, then  we will plan on doing a chemical sphincterotomy. If she has hemorrhoid disease, we will try to treat this as well. We discussed the risk of temporary incontinence, as well as postoperative pain and healing time needed after surgery. All questions were answered.

## 2020-02-05 NOTE — H&P (View-Only) (Signed)
History of Present Illness Romie Levee MD; 02/05/2020 2:15 PM) The patient is a 34 year old female who presents with hemorrhoids. 34 year old female who presents to the office for evaluation of anal pain. She reports over the past 3-4 weeks, she has had increase in anal pain. She reports regular bowel habits up until this most recent episode. She has tried multiple over-the-counter remedies including fiber supplementation and laxatives. She continues to have sharp pain with bowel movements. She describes prolapsing tissue with bowel movements or increased abdominal pressure. She is currently being treated for BV. She complains of pelvic pain as well. She was seen approximately 2 months ago. We started her on diltiazem ointment. She felt like this was too irritating and therefore was switched to nifedipine ointment. She has been on these 2 appointments for the past month. She reports a slight decrease in her symptoms, but continues to have significant bleeding and pain with bowel movements.    Problem List/Past Medical Romie Levee, MD; 02/05/2020 2:15 PM) Joesph Fillers FISSURE (K60.2)  Past Surgical History Romie Levee, MD; 02/05/2020 2:15 PM) Cesarean Section - Multiple Oral Surgery  Diagnostic Studies History Romie Levee, MD; 02/05/2020 2:15 PM) Colonoscopy never Mammogram 1-3 years ago Pap Smear 1-5 years ago  Allergies (April Staton, CMA; 02/05/2020 2:02 PM) Shellfish Hives.  Medication History (April Staton, CMA; 02/05/2020 2:02 PM) Lidocaine (5% Ointment, 1 (one) External as needed, Taken starting 01/22/2020) Active. Tylenol (500MG  Capsule, Oral) Active. Americaine (20% Ointment, Rectal) Active. Colace (100MG  Capsule, Oral) Active. Mirena (52 MG) (20MCG/24HR IUD, Intrauterine) Active. Naproxen (500MG  Tablet, Oral) Active. Zofran ODT (8MG  Tablet Disint, Oral) Active. SEROquel (50MG  Tablet, Oral) Active. Zanaflex (4MG  Capsule, Oral) Active. Flagyl (500MG   Tablet, Oral) Active. Medications Reconciled  Social History , MD; 02/05/2020 2:15 PM) Alcohol use Occasional alcohol use. Caffeine use Carbonated beverages, Coffee, Tea. Illicit drug use Uses socially only. Tobacco use Current every day smoker.  Family History , MD; 02/05/2020 2:15 PM) Alcohol Abuse Mother. Colon Cancer Father. Depression Mother. Hypertension Mother. Migraine Headache Mother.  Pregnancy / Birth History , MD; 02/05/2020 2:15 PM) Age at menarche 15 years. Contraceptive History Depo-provera, Intrauterine device, Oral contraceptives. Gravida 6 Irregular periods Length (months) of breastfeeding 12-24 Maternal age 29-20 Para 4  Other Problems 02/07/2020, MD; 02/05/2020 2:15 PM) Anxiety Disorder Back Pain Bladder Problems Depression Gastroesophageal Reflux Disease Hemorrhoids Migraine Headache    Vitals (April Staton CMA; 02/05/2020 2:02 PM) 02/05/2020 2:02 PM Weight: 114 lb Height: 64in Body Surface Area: 1.54 m Body Mass Index: 19.57 kg/m  Temp.: 98.18F(Tympanic)  Pulse: 96 (Regular)  P.OX: 98% (Room air) BP: 100/80(Sitting, Left Arm, Standard)        Physical Exam 09[birth year]1992 MD; 02/05/2020 2:15 PM)  General Mental Status-Alert. General Appearance-Cooperative. CV: RRR Lungs: CTA Abdomen Palpation/Percussion Palpation and Percussion of the abdomen reveal - Soft and Non Tender.  Rectal Anorectal Exam External - anal fissure and external hemorrhoids.    Assessment & Plan Romie Levee MD; 02/05/2020 2:14 PM)  ANAL FISSURE (K60.2) Impression: 34 year old female who complains of consistent anal pain. She has been on topical treatment for an anal fissure for approximately one month now. She continues to have pain and bleeding with bowel movements. On exam today, she has an edematous right posterior hemorrhoid and I am unable to tell if her anal fissure has  healed. Given her persistent symptoms, I think it is reasonable to proceed with anal exam under anesthesia. If a persistent fissure is noted, then  we will plan on doing a chemical sphincterotomy. If she has hemorrhoid disease, we will try to treat this as well. We discussed the risk of temporary incontinence, as well as postoperative pain and healing time needed after surgery. All questions were answered. 

## 2020-02-10 ENCOUNTER — Encounter (HOSPITAL_BASED_OUTPATIENT_CLINIC_OR_DEPARTMENT_OTHER): Payer: Self-pay | Admitting: General Surgery

## 2020-02-10 ENCOUNTER — Other Ambulatory Visit: Payer: Self-pay

## 2020-02-10 NOTE — Progress Notes (Addendum)
Addendum: spoke with jessica zanetto pa, order placed in epic for urine drug screen day of surgery, ask anesthesia md if wants urine drug screen done day of surgery.   Spoke w/ via phone for pre-op interview---patient Lab needs dos----  Urine poct             COVID test ------02-13-2020@935  am Arrive at -------1200 pm 02-17-2020 No food after midnight clear liquids from midnight until 800 am Medications to take morning of surgery -----none Diabetic medication -----n/a Patient Special Instructions -----none Pre-Op special Istructions -----none Patient verbalized understanding of instructions that were given at this phone interview. Patient denies shortness of breath, chest pain, fever, cough a this phone interview.

## 2020-02-12 ENCOUNTER — Emergency Department (HOSPITAL_COMMUNITY): Payer: Medicaid Other

## 2020-02-12 ENCOUNTER — Encounter (HOSPITAL_COMMUNITY): Payer: Self-pay | Admitting: *Deleted

## 2020-02-12 ENCOUNTER — Emergency Department (HOSPITAL_COMMUNITY)
Admission: EM | Admit: 2020-02-12 | Discharge: 2020-02-12 | Disposition: A | Discharge status: Home / Self Care | Payer: Medicaid Other | Attending: Emergency Medicine | Admitting: Emergency Medicine

## 2020-02-12 DIAGNOSIS — M545 Low back pain: Secondary | ICD-10-CM | POA: Diagnosis not present

## 2020-02-12 DIAGNOSIS — W208XXA Other cause of strike by thrown, projected or falling object, initial encounter: Secondary | ICD-10-CM | POA: Diagnosis not present

## 2020-02-12 DIAGNOSIS — Y929 Unspecified place or not applicable: Secondary | ICD-10-CM | POA: Diagnosis not present

## 2020-02-12 DIAGNOSIS — S99922A Unspecified injury of left foot, initial encounter: Secondary | ICD-10-CM | POA: Diagnosis present

## 2020-02-12 DIAGNOSIS — F17210 Nicotine dependence, cigarettes, uncomplicated: Secondary | ICD-10-CM | POA: Insufficient documentation

## 2020-02-12 DIAGNOSIS — S92425A Nondisplaced fracture of distal phalanx of left great toe, initial encounter for closed fracture: Secondary | ICD-10-CM | POA: Diagnosis not present

## 2020-02-12 DIAGNOSIS — S299XXA Unspecified injury of thorax, initial encounter: Secondary | ICD-10-CM | POA: Insufficient documentation

## 2020-02-12 DIAGNOSIS — Y9389 Activity, other specified: Secondary | ICD-10-CM | POA: Insufficient documentation

## 2020-02-12 DIAGNOSIS — S3992XA Unspecified injury of lower back, initial encounter: Secondary | ICD-10-CM | POA: Insufficient documentation

## 2020-02-12 DIAGNOSIS — Y999 Unspecified external cause status: Secondary | ICD-10-CM | POA: Diagnosis not present

## 2020-02-12 DIAGNOSIS — R0789 Other chest pain: Secondary | ICD-10-CM | POA: Insufficient documentation

## 2020-02-12 MED ORDER — NAPROXEN 375 MG PO TABS
375.0000 mg | ORAL_TABLET | Freq: Two times a day (BID) | ORAL | 0 refills | Status: DC | PRN
Start: 2020-02-12 — End: 2020-05-24

## 2020-02-12 MED ORDER — KETOROLAC TROMETHAMINE 60 MG/2ML IM SOLN
60.0000 mg | Freq: Once | INTRAMUSCULAR | Status: AC
  Administered 2020-02-12: 60 mg via INTRAMUSCULAR
  Filled 2020-02-12: qty 2

## 2020-02-12 NOTE — ED Notes (Signed)
Ortho tech paged  

## 2020-02-12 NOTE — ED Triage Notes (Signed)
To ED for eval of right side pain and back pain. States she fell yesterday while carrying a box of food. States she was sore last night before going to bed - took an ibuprofen. When woke this am, she turned over and pain increased. Also complains of left great toe pain and rectal pain. Pt is due to see surgeon for hemorrhoids next week.

## 2020-02-12 NOTE — ED Notes (Signed)
Pt ambulated to cafeteria w/o assistance

## 2020-02-12 NOTE — Progress Notes (Signed)
Orthopedic Tech Progress Note Patient Details:  Mandy Wilson [birth year] [id number]  Ortho Devices Type of Ortho Device: Postop shoe/boot, Crutches Ortho Device/Splint Location: LLE Ortho Device/Splint Interventions: Application, Ordered   Post Interventions Patient Tolerated: Well Instructions Provided: Care of device   Jammy Stlouis A Camie Hauss 02/12/2020, 12:59 PM

## 2020-02-12 NOTE — ED Provider Notes (Signed)
[facility] EMERGENCY DEPARTMENT Provider Note   CSN: [id number] Arrival date & time: 02/12/20  3664     History Chief Complaint  Patient presents with  . Back Pain  . Chest Pain    Mandy Wilson is a 34 y.o. female with pertinent past medical history of bipolar 1, depression that presents to the emergency department today for right-sided pain on chest and back after a box fell on her that occurred yesterday.  States that the box was full of food, does not know how much it weighed, however states that it was full of snacks.  States that the box fell on her after she tripped on her kids toy and stubbed her left toe, she fell onto the couch.  Did not hit her head her neck.  Is not on any blood thinners.  Denies alcohol use prior to injury.  States that she has tried some ibuprofen yesterday without much relief.  States that her sternum hurts along with her lower back and left great toe.  Right sided chest wall pain is nonexertional, does not radiate, nonpleuritic, no shortness of breath.  Back pain without dysuria or hematuria, was previously not there before injury, no paresthesias or radiation down leg.Marland Kitchen  No previous injuries.  Triage note states that she is complaining of rectal pain, however when speaking to the patient patient states that she is seeing surgeon for this on Wednesday at [facility] for hemorrhoid removal.  Denies any exertional chest pain, pleuritic chest pain, shortness of breath, weakness, dizziness, headache, vision changes, neck pain, fever, chills.  Is taking more medications and bipolar medications daily.  HPI     Past Medical History:  Diagnosis Date  . Anal fissure   . Bipolar 1 disorder ([place])   . Depression   . Rash    breasts and stomach for last month made appt at [facility] to see about     Patient Active Problem List   Diagnosis Date Noted  . Adjustment disorder with mixed disturbance of emotions and conduct  04/26/2018    Past Surgical History:  Procedure Laterality Date  . CESAREAN SECTION     X3  . OPEN REDUCTION INTERNAL FIXATION (ORIF) DISTAL PHALANX Right 09/02/2019   Procedure: OPEN REDUCTION INTERNAL FIXATION (ORIF) right long finger malunion repair;  Surgeon: Iran Planas, MD;  Location: [facility];  Service: Orthopedics;  Laterality: Right;     OB History    Gravida  1   Para      Term      Preterm      AB      Living        SAB      TAB      Ectopic      Multiple      Live Births              Family History  Problem Relation Age of Onset  . Asthma Mother   . Asthma Father     Social History   Tobacco Use  . Smoking status: Current Every Day Smoker    Packs/day: 0.50    Years: 12.00    Pack years: 6.00    Types: Cigarettes  . Smokeless tobacco: Never Used  Substance Use Topics  . Alcohol use: Yes    Comment: pint some days not every day, social  . Drug use: Yes    Types: Marijuana, "Crack" cocaine  Comment: cocaine last use 02-09-2020, marijuana use every day    Home Medications Prior to Admission medications   Medication Sig Start Date End Date Taking? Authorizing Provider  acetaminophen (TYLENOL) 500 MG tablet Take 1 tablet (500 mg total) by mouth every 6 (six) hours as needed. 12/23/19  Yes Fawze, Mina A, PA-C  benzocaine (AMERICAINE) 20 % rectal ointment Place rectally every 3 (three) hours as needed for pain. 12/23/19  Yes Fawze, Mina A, PA-C  docusate sodium (COLACE) 100 MG capsule Take 1 capsule (100 mg total) by mouth every 12 (twelve) hours. 12/23/19  Yes Fawze, Mina A, PA-C  ibuprofen (ADVIL) 200 MG tablet Take 1,200 mg by mouth every 6 (six) hours as needed for moderate pain.   Yes [provider]  levonorgestrel (MIRENA) 20 MCG/24HR IUD 1 each by Intrauterine route once. Inserted 2018   Yes Alver Fisher, RN  phenylephrine (,USE FOR PREPARATION-H,) 0.25 % suppository Place 1 suppository rectally 2 (two)  times daily. 12/23/19  Yes Fawze, Mina A, PA-C  QUEtiapine (SEROQUEL) 50 MG tablet Take 50 mg by mouth 2 (two) times daily.    Yes Alver Fisher, RN  naproxen (NAPROSYN) 375 MG tablet Take 1 tablet (375 mg total) by mouth 2 (two) times daily as needed for moderate pain. 02/12/20   Mandy Gordon, PA-C    Allergies    Shellfish allergy  Review of Systems   Review of Systems  Constitutional: Negative for chills, diaphoresis, fatigue and fever.  HENT: Negative for congestion, sore throat and trouble swallowing.   Eyes: Negative for pain and visual disturbance.  Respiratory: Negative for cough, shortness of breath and wheezing.   Cardiovascular: Positive for chest pain. Negative for palpitations and leg swelling.  Gastrointestinal: Negative for abdominal distention, abdominal pain, diarrhea, nausea and vomiting.  Genitourinary: Negative for difficulty urinating.  Musculoskeletal: Positive for arthralgias (Left toe) and back pain. Negative for neck pain and neck stiffness.  Skin: Negative for pallor.  Neurological: Negative for dizziness, speech difficulty, weakness and headaches.  Psychiatric/Behavioral: Negative for confusion.    Physical Exam Updated Vital Signs BP 122/80   Pulse 76   Temp 98.4 F (36.9 C) (Oral)   Resp 20   LMP 01/23/2020 (Exact Date) Comment: implant  SpO2 100%   Physical Exam Constitutional:      General: She is not in acute distress.    Appearance: Normal appearance. She is not ill-appearing, toxic-appearing or diaphoretic.  HENT:     Mouth/Throat:     Mouth: Mucous membranes are moist.     Pharynx: Oropharynx is clear.  Eyes:     General: No scleral icterus.    Extraocular Movements: Extraocular movements intact.     Pupils: Pupils are equal, round, and reactive to light.  Cardiovascular:     Rate and Rhythm: Normal rate and regular rhythm.     Pulses: Normal pulses.     Heart sounds: Normal heart sounds.  Pulmonary:     Effort: Pulmonary effort is  normal. No respiratory distress.     Breath sounds: Normal breath sounds. No stridor. No wheezing, rhonchi or rales.  Chest:     Chest wall: Tenderness (To sternum and right side of the chest where a box fell, no signs of contusions or ecchymosis.  No lacerations noted.  Patient is tender however is able to move chest in all directions.) present.  Abdominal:     General: Abdomen is flat. There is no distension.     Palpations:  Abdomen is soft.     Tenderness: There is no abdominal tenderness. There is no guarding or rebound.  Musculoskeletal:        General: No swelling. Normal range of motion.     Cervical back: Normal range of motion and neck supple. No rigidity or tenderness.     Lumbar back: Tenderness present.       Back:     Right lower leg: No edema.     Left lower leg: No edema.     Left foot: Tenderness (Great toe with erythema and tenderness, is able to move all toe joints in all directions.  No ecchymosis noted.  Mild swelling noted.  No lacerations.  Patient is able to ambulate and bear weight.Marland Kitchen) present.     Comments: No cervical, thoracic midline tenderness.  Lymphadenopathy:     Cervical: No cervical adenopathy.  Skin:    General: Skin is warm and dry.     Capillary Refill: Capillary refill takes less than 2 seconds.     Coloration: Skin is not pale.  Neurological:     General: No focal deficit present.     Mental Status: She is alert and oriented to person, place, and time.  Psychiatric:        Mood and Affect: Mood normal.        Behavior: Behavior normal.     ED Results / Procedures / Treatments   Labs (all labs ordered are listed, but only abnormal results are displayed) Labs Reviewed - No data to display  EKG EKG Interpretation  Date/Time:  Friday Feb 12 2020 06:48:32 EDT Ventricular Rate:  82 PR Interval:  154 QRS Duration: 74 QT Interval:  384 QTC Calculation: 448 R Axis:   82 Text Interpretation: Normal sinus rhythm with sinus arrhythmia Normal  ECG Confirmed by Mandy Wilson [phone]) on 02/12/2020 8:23:42 AM   Radiology DG Chest 2 View  Result Date: 02/12/2020 CLINICAL DATA:  Chest pain EXAM: CHEST - 2 VIEW COMPARISON:  None. FINDINGS: Normal heart size and mediastinal contours. Artifact from EKG leads. No acute infiltrate or edema. No effusion or pneumothorax. No acute osseous findings. IMPRESSION: Negative chest. Electronically Signed   By: Marnee Spring M.D.   On: 02/12/2020 07:24   DG Lumbar Spine Complete  Result Date: 02/12/2020 CLINICAL DATA:  Pain following fall EXAM: LUMBAR SPINE - COMPLETE 4+ VIEW COMPARISON:  None. FINDINGS: Frontal, lateral, spot lumbosacral lateral, and bilateral oblique views were obtained. There are 5 non-rib-bearing lumbar type vertebral bodies. There is no fracture or spondylolisthesis. Disc spaces appear normal. There is no appreciable facet arthropathy. There is an intrauterine device in the mid-pelvis. IMPRESSION: No fracture or spondylolisthesis.  No evident arthropathy. Intrauterine device in mid pelvis. Electronically Signed   By: Bretta Bang III M.D.   On: 02/12/2020 10:03   DG Toe Great Left  Result Date: 02/12/2020 CLINICAL DATA:  Fall.  Left great toe pain.  Swelling. EXAM: LEFT GREAT TOE COMPARISON:  03/24/2018. FINDINGS: Nondisplaced fracture noted the base of the distal phalanx of the left great toe. Fracture may extend into the distal interphalangeal joint space. No radiopaque foreign body. IMPRESSION: Nondisplaced fracture of the base of the distal phalanx of the left great toe. Fracture may extend into the distal interphalangeal joint space. Electronically Signed   By: Maisie Fus  Register   On: 02/12/2020 10:02    Procedures Procedures (including critical care time)  Medications Ordered in ED Medications  ketorolac (TORADOL) injection 60 mg (60 mg  Intramuscular Given 02/12/20 1006)    ED Course  I have reviewed the triage vital signs and the nursing notes.  Pertinent labs &  imaging results that were available during my care of the patient were reviewed by me and considered in my medical decision making (see chart for details).    MDM Rules/Calculators/A&P                     Naomii Kreger is a 34 y.o. female with pertinent past medical history of bipolar 1, depression that presents to the emergency department today for right-sided pain on chest and back after a box fell on her yesterday.  Patient with back pain, chest pain, toe pain due to fall.  Do not think ACS work-up is needed at this time since she was in normal health yesterday.  Never experienced chest pain before.  Chest pain is reproducible on exam.  No exertional component, radiation, shortness of breath.  Initial interventions include Toradol for pain. Chest x-ray and back x-ray negative.  Left toe x-ray with nondisplaced fracture of great toe, questionable extension into the distal interphalangeal joint space.Marland KitchenSpoke to [employer], PA-C Ortho, who recommended postop shoe and crutches.  Upon reassessment patient feels much better.  Is agreeable with plan of postop shoe and crutches.  Will give Ortho follow-up. Will have patient take NSAIDs for chest and back pain secondary to box falling on her.  Patient states that pain has significantly decreased, is able to ambulate with crutches.  Provided instructions on usage.  Doubt need for further emergent work up at this time. I explained the diagnosis and have given explicit precautions to return to the ER including for any other new or worsening symptoms. The patient understands and accepts the medical plan as it's been dictated and I have answered their questions. Discharge instructions concerning home care and prescriptions have been given. The patient is STABLE and is discharged to home in good condition.  Printed out resources for how to handle toe fracture and muscle pain.  Discharged home with naproxen.  Explained in dispo that patient needs to come back  if chest pain or back pain become worse, chest pain comes exertional, radiation occurs or shortness of breath.  Gave specific discharge instructions about her toe and when to come back to the emergency department.   Final Clinical Impression(s) / ED Diagnoses Final diagnoses:  Closed nondisplaced fracture of distal phalanx of left great toe, initial encounter  Chest wall pain    Rx / DC Orders ED Discharge Orders         Ordered    naproxen (NAPROSYN) 375 MG tablet  2 times daily PRN     02/12/20 1215           Mandy Gordon, PA-C 02/12/20 1225    Mandy Mulders, MD 03/15/20 2052

## 2020-02-12 NOTE — Discharge Instructions (Addendum)
You were seen today, your chest x-ray and back x-ray are negative for fracture.  Your toe was fractured.  I want you to wear the boot and wear crutches until you follow-up with Ortho.  I provided you with her information.  I want you to take naproxen as prescribed on the bottle for pain.  I want you to get plenty of rest and use the attached instructions.  Rest ice and elevate your leg.  Come back to the emergency department if you have any worsening symptoms, use the guides for this.  You will probably be sore from the box from your chest and your back for a couple of days, the naproxen and ice will help for this.  Come back to the emergency department if you have any chest pain that is worse with exertion, rating down your neck or jaw, shortness of breath, weakness.

## 2020-02-13 ENCOUNTER — Other Ambulatory Visit (HOSPITAL_COMMUNITY): Payer: Medicaid Other

## 2020-02-15 ENCOUNTER — Other Ambulatory Visit (HOSPITAL_COMMUNITY)
Admission: RE | Admit: 2020-02-15 | Discharge: 2020-02-15 | Disposition: A | Discharge status: Home / Self Care | Payer: Medicaid Other | Source: Ambulatory Visit | Attending: General Surgery | Admitting: General Surgery

## 2020-02-15 DIAGNOSIS — Z20822 Contact with and (suspected) exposure to covid-19: Secondary | ICD-10-CM | POA: Diagnosis not present

## 2020-02-15 DIAGNOSIS — Z01812 Encounter for preprocedural laboratory examination: Secondary | ICD-10-CM | POA: Diagnosis present

## 2020-02-15 LAB — SARS CORONAVIRUS 2 (TAT 6-24 HRS): SARS Coronavirus 2: NEGATIVE

## 2020-02-17 ENCOUNTER — Encounter (HOSPITAL_BASED_OUTPATIENT_CLINIC_OR_DEPARTMENT_OTHER): Payer: Self-pay | Admitting: General Surgery

## 2020-02-17 ENCOUNTER — Encounter (HOSPITAL_BASED_OUTPATIENT_CLINIC_OR_DEPARTMENT_OTHER)
Admission: RE | Disposition: A | Discharge status: Home / Self Care | Payer: Self-pay | Source: Home / Self Care | Attending: General Surgery

## 2020-02-17 ENCOUNTER — Ambulatory Visit (HOSPITAL_BASED_OUTPATIENT_CLINIC_OR_DEPARTMENT_OTHER)
Admission: RE | Admit: 2020-02-17 | Discharge: 2020-02-17 | Disposition: A | Discharge status: Home / Self Care | Payer: Medicaid Other | Attending: General Surgery | Admitting: General Surgery

## 2020-02-17 ENCOUNTER — Ambulatory Visit (HOSPITAL_BASED_OUTPATIENT_CLINIC_OR_DEPARTMENT_OTHER): Payer: Medicaid Other | Admitting: Physician Assistant

## 2020-02-17 ENCOUNTER — Other Ambulatory Visit: Payer: Self-pay

## 2020-02-17 DIAGNOSIS — K219 Gastro-esophageal reflux disease without esophagitis: Secondary | ICD-10-CM | POA: Insufficient documentation

## 2020-02-17 DIAGNOSIS — F319 Bipolar disorder, unspecified: Secondary | ICD-10-CM | POA: Insufficient documentation

## 2020-02-17 DIAGNOSIS — Z811 Family history of alcohol abuse and dependence: Secondary | ICD-10-CM | POA: Insufficient documentation

## 2020-02-17 DIAGNOSIS — Z8249 Family history of ischemic heart disease and other diseases of the circulatory system: Secondary | ICD-10-CM | POA: Diagnosis not present

## 2020-02-17 DIAGNOSIS — Z79899 Other long term (current) drug therapy: Secondary | ICD-10-CM | POA: Diagnosis not present

## 2020-02-17 DIAGNOSIS — K602 Anal fissure, unspecified: Secondary | ICD-10-CM | POA: Insufficient documentation

## 2020-02-17 DIAGNOSIS — Z818 Family history of other mental and behavioral disorders: Secondary | ICD-10-CM | POA: Insufficient documentation

## 2020-02-17 DIAGNOSIS — K641 Second degree hemorrhoids: Secondary | ICD-10-CM | POA: Insufficient documentation

## 2020-02-17 DIAGNOSIS — Z800 Family history of malignant neoplasm of digestive organs: Secondary | ICD-10-CM | POA: Diagnosis not present

## 2020-02-17 DIAGNOSIS — F419 Anxiety disorder, unspecified: Secondary | ICD-10-CM | POA: Insufficient documentation

## 2020-02-17 DIAGNOSIS — M549 Dorsalgia, unspecified: Secondary | ICD-10-CM | POA: Insufficient documentation

## 2020-02-17 DIAGNOSIS — Z91013 Allergy to seafood: Secondary | ICD-10-CM | POA: Diagnosis not present

## 2020-02-17 DIAGNOSIS — G43909 Migraine, unspecified, not intractable, without status migrainosus: Secondary | ICD-10-CM | POA: Insufficient documentation

## 2020-02-17 DIAGNOSIS — Z820 Family history of epilepsy and other diseases of the nervous system: Secondary | ICD-10-CM | POA: Insufficient documentation

## 2020-02-17 DIAGNOSIS — F17200 Nicotine dependence, unspecified, uncomplicated: Secondary | ICD-10-CM | POA: Insufficient documentation

## 2020-02-17 HISTORY — DX: Anal fissure, unspecified: K60.2

## 2020-02-17 HISTORY — PX: RECTAL EXAM UNDER ANESTHESIA: SHX6399

## 2020-02-17 HISTORY — PX: SPHINCTEROTOMY: SHX5279

## 2020-02-17 LAB — RAPID URINE DRUG SCREEN, HOSP PERFORMED
Amphetamines: NOT DETECTED
Barbiturates: NOT DETECTED
Benzodiazepines: NOT DETECTED
Cocaine: NOT DETECTED
Opiates: NOT DETECTED
Tetrahydrocannabinol: POSITIVE — AB

## 2020-02-17 LAB — POCT PREGNANCY, URINE: Preg Test, Ur: NEGATIVE

## 2020-02-17 SURGERY — EXAM UNDER ANESTHESIA, RECTUM
Anesthesia: Monitor Anesthesia Care | Site: Rectum

## 2020-02-17 MED ORDER — ACETAMINOPHEN 160 MG/5ML PO SOLN: 325.0000 mg | ORAL | Status: DC | PRN

## 2020-02-17 MED ORDER — OXYCODONE HCL 5 MG PO TABS: 5.0000 mg | ORAL_TABLET | Freq: Once | ORAL | Status: DC | PRN

## 2020-02-17 MED ORDER — ONDANSETRON HCL 4 MG/2ML IJ SOLN: 4.0000 mg | Freq: Once | INTRAMUSCULAR | Status: DC | PRN

## 2020-02-17 MED ORDER — MIDAZOLAM HCL 2 MG/2ML IJ SOLN
INTRAMUSCULAR | Status: AC
  Filled 2020-02-17: qty 2

## 2020-02-17 MED ORDER — HYDROCODONE-ACETAMINOPHEN 5-325 MG PO TABS: 1.0000 | ORAL_TABLET | Freq: Four times a day (QID) | ORAL | 0 refills | Status: DC | PRN

## 2020-02-17 MED ORDER — BUPIVACAINE-EPINEPHRINE 0.5% -1:200000 IJ SOLN
INTRAMUSCULAR | Status: DC | PRN
  Administered 2020-02-17: 30 mL

## 2020-02-17 MED ORDER — GABAPENTIN 300 MG PO CAPS
300.0000 mg | ORAL_CAPSULE | ORAL | Status: AC
  Administered 2020-02-17: 300 mg via ORAL

## 2020-02-17 MED ORDER — ACETAMINOPHEN 325 MG PO TABS: 325.0000 mg | ORAL_TABLET | ORAL | Status: DC | PRN

## 2020-02-17 MED ORDER — SODIUM CHLORIDE (PF) 0.9 % IJ SOLN
INTRAMUSCULAR | Status: DC | PRN
  Administered 2020-02-17: 50 mL

## 2020-02-17 MED ORDER — PROPOFOL 500 MG/50ML IV EMUL
INTRAVENOUS | Status: DC | PRN
  Administered 2020-02-17: 150 ug/kg/min via INTRAVENOUS

## 2020-02-17 MED ORDER — FENTANYL CITRATE (PF) 100 MCG/2ML IJ SOLN
INTRAMUSCULAR | Status: AC
  Filled 2020-02-17: qty 2

## 2020-02-17 MED ORDER — FENTANYL CITRATE (PF) 100 MCG/2ML IJ SOLN
INTRAMUSCULAR | Status: DC | PRN
  Administered 2020-02-17: 25 ug via INTRAVENOUS

## 2020-02-17 MED ORDER — CELECOXIB 200 MG PO CAPS
200.0000 mg | ORAL_CAPSULE | ORAL | Status: AC
  Administered 2020-02-17: 200 mg via ORAL

## 2020-02-17 MED ORDER — BUPIVACAINE LIPOSOME 1.3 % IJ SUSP: 20.0000 mL | Freq: Once | INTRAMUSCULAR | Status: DC

## 2020-02-17 MED ORDER — ACETAMINOPHEN 500 MG PO TABS
ORAL_TABLET | ORAL | Status: AC
  Filled 2020-02-17: qty 2

## 2020-02-17 MED ORDER — ACETAMINOPHEN 500 MG PO TABS
1000.0000 mg | ORAL_TABLET | ORAL | Status: AC
  Administered 2020-02-17: 1000 mg via ORAL

## 2020-02-17 MED ORDER — LACTATED RINGERS IV SOLN
INTRAVENOUS | Status: DC
  Administered 2020-02-17: 50 mL/h via INTRAVENOUS

## 2020-02-17 MED ORDER — PROPOFOL 500 MG/50ML IV EMUL
INTRAVENOUS | Status: AC
  Filled 2020-02-17: qty 50

## 2020-02-17 MED ORDER — MIDAZOLAM HCL 5 MG/5ML IJ SOLN
INTRAMUSCULAR | Status: DC | PRN
  Administered 2020-02-17: 2 mg via INTRAVENOUS

## 2020-02-17 MED ORDER — OXYCODONE HCL 5 MG/5ML PO SOLN: 5.0000 mg | Freq: Once | ORAL | Status: DC | PRN

## 2020-02-17 MED ORDER — PROPOFOL 10 MG/ML IV BOLUS
INTRAVENOUS | Status: DC | PRN
  Administered 2020-02-17: 20 mg via INTRAVENOUS
  Administered 2020-02-17: 50 mg via INTRAVENOUS

## 2020-02-17 MED ORDER — GABAPENTIN 300 MG PO CAPS
ORAL_CAPSULE | ORAL | Status: AC
  Filled 2020-02-17: qty 1

## 2020-02-17 MED ORDER — CELECOXIB 200 MG PO CAPS
ORAL_CAPSULE | ORAL | Status: AC
  Filled 2020-02-17: qty 1

## 2020-02-17 MED ORDER — FENTANYL CITRATE (PF) 100 MCG/2ML IJ SOLN: 25.0000 ug | INTRAMUSCULAR | Status: DC | PRN

## 2020-02-17 MED ORDER — KETOROLAC TROMETHAMINE 30 MG/ML IJ SOLN: 30.0000 mg | Freq: Once | INTRAMUSCULAR | Status: DC | PRN

## 2020-02-17 MED ORDER — SODIUM CHLORIDE 0.9% FLUSH: 3.0000 mL | Freq: Two times a day (BID) | INTRAVENOUS | Status: DC

## 2020-02-17 MED ORDER — LIDOCAINE 2% (20 MG/ML) 5 ML SYRINGE
INTRAMUSCULAR | Status: AC
  Filled 2020-02-17: qty 5

## 2020-02-17 MED ORDER — LIDOCAINE 2% (20 MG/ML) 5 ML SYRINGE
INTRAMUSCULAR | Status: DC | PRN
  Administered 2020-02-17: 100 mg via INTRAVENOUS

## 2020-02-17 MED ORDER — ONABOTULINUMTOXINA 100 UNITS IJ SOLR
INTRAMUSCULAR | Status: DC | PRN
  Administered 2020-02-17: 100 [IU] via INTRAMUSCULAR

## 2020-02-17 MED ORDER — MEPERIDINE HCL 25 MG/ML IJ SOLN: 6.2500 mg | INTRAMUSCULAR | Status: DC | PRN

## 2020-02-17 SURGICAL SUPPLY — 57 items
BENZOIN TINCTURE PRP APPL 2/3 (GAUZE/BANDAGES/DRESSINGS) ×3 IMPLANT
BLADE EXTENDED COATED 6.5IN (ELECTRODE) IMPLANT
BLADE HEX COATED 2.75 (ELECTRODE) ×3 IMPLANT
BLADE SURG 10 STRL SS (BLADE) IMPLANT
BLADE SURG 15 STRL LF DISP TIS (BLADE) ×1 IMPLANT
BLADE SURG 15 STRL SS (BLADE) ×2
BRIEF STRETCH FOR OB PAD LRG (UNDERPADS AND DIAPERS) ×3 IMPLANT
CANISTER SUCT 3000ML PPV (MISCELLANEOUS) ×3 IMPLANT
COVER BACK TABLE 60X90IN (DRAPES) ×3 IMPLANT
COVER MAYO STAND STRL (DRAPES) ×3 IMPLANT
COVER WAND RF STERILE (DRAPES) ×3 IMPLANT
DECANTER SPIKE VIAL GLASS SM (MISCELLANEOUS) ×3 IMPLANT
DRAPE LAPAROTOMY 100X72 PEDS (DRAPES) ×3 IMPLANT
DRAPE UTILITY XL STRL (DRAPES) ×3 IMPLANT
DRSG PAD ABDOMINAL 8X10 ST (GAUZE/BANDAGES/DRESSINGS) ×3 IMPLANT
ELECT REM PT RETURN 9FT ADLT (ELECTROSURGICAL) ×3
ELECTRODE REM PT RTRN 9FT ADLT (ELECTROSURGICAL) ×1 IMPLANT
GAUZE SPONGE 4X4 12PLY STRL (GAUZE/BANDAGES/DRESSINGS) ×3 IMPLANT
GLOVE BIO SURGEON STRL SZ 6.5 (GLOVE) ×2 IMPLANT
GLOVE BIO SURGEONS STRL SZ 6.5 (GLOVE) ×1
GLOVE BIOGEL PI IND STRL 7.0 (GLOVE) ×1 IMPLANT
GLOVE BIOGEL PI INDICATOR 7.0 (GLOVE) ×2
GOWN STRL REUS W/TWL 2XL LVL3 (GOWN DISPOSABLE) ×3 IMPLANT
GOWN STRL REUS W/TWL XL LVL3 (GOWN DISPOSABLE) ×3 IMPLANT
HYDROGEN PEROXIDE 16OZ (MISCELLANEOUS) ×3 IMPLANT
IV CATH 14GX2 1/4 (CATHETERS) ×3 IMPLANT
IV CATH 18G SAFETY (IV SOLUTION) ×3 IMPLANT
KIT SIGMOIDOSCOPE (SET/KITS/TRAYS/PACK) IMPLANT
KIT TURNOVER CYSTO (KITS) ×3 IMPLANT
LOOP VESSEL MAXI BLUE (MISCELLANEOUS) IMPLANT
NDL SAFETY ECLIPSE 18X1.5 (NEEDLE) IMPLANT
NEEDLE HYPO 18GX1.5 SHARP (NEEDLE)
NEEDLE HYPO 22GX1.5 SAFETY (NEEDLE) ×3 IMPLANT
NS IRRIG 500ML POUR BTL (IV SOLUTION) ×3 IMPLANT
PAD ARMBOARD 7.5X6 YLW CONV (MISCELLANEOUS) IMPLANT
PENCIL BUTTON HOLSTER BLD 10FT (ELECTRODE) ×3 IMPLANT
SET BASIN DAY SURGERY F.S. (CUSTOM PROCEDURE TRAY) ×3 IMPLANT
SPONGE HEMORRHOID 8X3CM (HEMOSTASIS) IMPLANT
SPONGE SURGIFOAM ABS GEL 12-7 (HEMOSTASIS) IMPLANT
SUCTION FRAZIER HANDLE 10FR (MISCELLANEOUS)
SUCTION TUBE FRAZIER 10FR DISP (MISCELLANEOUS) IMPLANT
SUT CHROMIC 2 0 SH (SUTURE) ×3 IMPLANT
SUT CHROMIC 3 0 SH 27 (SUTURE) IMPLANT
SUT ETHIBOND 0 (SUTURE) IMPLANT
SUT VIC AB 2-0 SH 27 (SUTURE)
SUT VIC AB 2-0 SH 27XBRD (SUTURE) IMPLANT
SUT VIC AB 3-0 SH 18 (SUTURE) IMPLANT
SUT VIC AB 3-0 SH 27 (SUTURE)
SUT VIC AB 3-0 SH 27XBRD (SUTURE) IMPLANT
SYR CONTROL 10ML LL (SYRINGE) ×3 IMPLANT
TOWEL OR 17X26 10 PK STRL BLUE (TOWEL DISPOSABLE) ×3 IMPLANT
TRAY DSU PREP LF (CUSTOM PROCEDURE TRAY) ×3 IMPLANT
TUBE CONNECTING 12'X1/4 (SUCTIONS) ×1
TUBE CONNECTING 12X1/4 (SUCTIONS) ×2 IMPLANT
UNDERPAD 30X30 (UNDERPADS AND DIAPERS) ×3 IMPLANT
WATER STERILE IRR 500ML POUR (IV SOLUTION) ×3 IMPLANT
YANKAUER SUCT BULB TIP NO VENT (SUCTIONS) ×3 IMPLANT

## 2020-02-17 NOTE — Anesthesia Preprocedure Evaluation (Signed)
Anesthesia Evaluation  Patient identified by MRN, date of birth, ID band Patient awake    Reviewed: Allergy & Precautions, NPO status , Patient's Chart, lab work & pertinent test results  History of Anesthesia Complications Negative for: history of anesthetic complications  Airway Mallampati: I  TM Distance: >3 FB Neck ROM: Full    Dental no notable dental hx. (+) Teeth Intact   Pulmonary Current Smoker and Patient abstained from smoking.,    Pulmonary exam normal breath sounds clear to auscultation       Cardiovascular negative cardio ROS Normal cardiovascular exam Rhythm:Regular Rate:Normal     Neuro/Psych PSYCHIATRIC DISORDERS Depression Bipolar Disorder negative neurological ROS     GI/Hepatic negative GI ROS, Neg liver ROS,   Endo/Other  negative endocrine ROS  Renal/GU negative Renal ROS  negative genitourinary   Musculoskeletal negative musculoskeletal ROS (+)   Abdominal Normal abdominal exam  (+)   Peds  Hematology negative hematology ROS (+)   Anesthesia Other Findings   Reproductive/Obstetrics                             Anesthesia Physical  Anesthesia Plan  ASA: II  Anesthesia Plan: MAC   Post-op Pain Management:    Induction:   PONV Risk Score and Plan: 1 and Ondansetron, Treatment may vary due to age or medical condition and Midazolam  Airway Management Planned: Natural Airway, Simple Face Mask and Nasal Cannula  Additional Equipment: None  Intra-op Plan:   Post-operative Plan:   Informed Consent: I have reviewed the patients History and Physical, chart, labs and discussed the procedure including the risks, benefits and alternatives for the proposed anesthesia with the patient or authorized representative who has indicated his/her understanding and acceptance.       Plan Discussed with: CRNA  Anesthesia Plan Comments:         Anesthesia Quick  Evaluation

## 2020-02-17 NOTE — Progress Notes (Signed)
Barbell piercing found in pts room after pt d/c'd.  Left message on pts voicemail that piercing is at nsg station and available for pickup in am at [phone].

## 2020-02-17 NOTE — Anesthesia Procedure Notes (Signed)
Procedure Name: MAC Date/Time: 02/17/2020 1:49 PM Performed by: Bonney Aid, CRNA Pre-anesthesia Checklist: Timeout performed, Emergency Drugs available, Patient identified, Suction available and Patient being monitored Patient Re-evaluated:Patient Re-evaluated prior to induction Oxygen Delivery Method: Nasal cannula Placement Confirmation: positive ETCO2

## 2020-02-17 NOTE — Interval H&P Note (Signed)
History and Physical Interval Note:  02/17/2020 11:54 AM  Mandy Wilson  has presented today for surgery, with the diagnosis of ANAL FISSURE.  The various methods of treatment have been discussed with the patient and family. After consideration of risks, benefits and other options for treatment, the patient has consented to  Procedure(s): ANAL EXAM UNDER ANESTHESIA (N/A) POSSIBLE CHEMICAL SPHINCTEROTOMY BOTOX (N/A) as a surgical intervention.  The patient's history has been reviewed, patient examined, no change in status, stable for surgery.  I have reviewed the patient's chart and labs.  Questions were answered to the patient's satisfaction.     Vanita Panda, MD  Colorectal and General Surgery [facility]

## 2020-02-17 NOTE — Op Note (Signed)
02/17/2020  2:02 PM  PATIENT:  Mandy Wilson  34 y.o. female  Patient Care Team: Patient, No Pcp Per as PCP - General (General Practice)  PRE-OPERATIVE DIAGNOSIS:  ANAL FISSURE  POST-OPERATIVE DIAGNOSIS:  ANAL FISSURE, grade 2 internal hemorrhoid  PROCEDURE:   ANAL EXAM UNDER ANESTHESIA CHEMICAL SPHINCTEROTOMY BOTOX Hemorrhoidopexy    Surgeon(s): Leighton Ruff, MD  ASSISTANT: none   ANESTHESIA:   local and MAC  SPECIMEN:  Source of Specimen:  none  DISPOSITION OF SPECIMEN:  PATHOLOGY  COUNTS:  YES  PLAN OF CARE: Discharge to home after PACU  PATIENT DISPOSITION:  PACU - hemodynamically stable.  INDICATION: 34 y.o. F with chronic anal pain.  She has completed a course of topical medication with no success.   OR FINDINGS: Grade 2 inflamed internal hemorrhoid with associated shallow anal fissure, significant sphincter hypertension   DESCRIPTION: the patient was identified in the preoperative holding area and taken to the OR where they were laid on the operating room table.  MAC anesthesia was induced without difficulty. The patient was then positioned in prone jackknife position with buttocks gently taped apart.  The patient was then prepped and draped in usual sterile fashion.  SCDs were noted to be in place prior to the initiation of anesthesia. A surgical timeout was performed indicating the correct patient, procedure, positioning and need for preoperative antibiotics.  A rectal block was performed using Marcaine with epinephrine.    I began with a digital rectal exam.  There was significant sphincter hypertension noted.  I dilated the anal canal gently to approximately 2 fingerbreadths.  I then placed a Hill-Ferguson anoscope into the anal canal and evaluated this completely.  There was a shallow posterior midline fissure.  There was a relatively large grade 2 internal hemorrhoid with signs of active inflammation.  No other internal hemorrhoids were noted.  I was unsure  if the fissure or the hemorrhoid was causing her pain and therefore I decided to treat both.  I performed a hemorrhoidal pexy of the right posterior hemorrhoid, using a 2-0 chromic suture.  Once this was completed 100 units of Botox was injected into the intersphincteric groove circumferentially.  Additional local was injected around the operative site.  Hemostasis was good.  A dressing was applied.  The patient was then awakened from anesthesia and sent to the postanesthesia care unit in stable condition.  All counts were correct per operating room staff.

## 2020-02-17 NOTE — Discharge Instructions (Addendum)
You had a small hemorrhoid and anal fissure.  Both were treated.    ANORECTAL SURGERY: POST OP INSTRUCTIONS 1. Take your usually prescribed home medications unless otherwise directed. 2. DIET: During the first few hours after surgery sip on some liquids until you are able to urinate.  It is normal to not urinate for several hours after this surgery.  If you feel uncomfortable, please contact the office for instructions.  After you are able to urinate,you may eat, if you feel like it.  Follow a light bland diet the first 24 hours after arrival home, such as soup, liquids, crackers, etc.  Be sure to include lots of fluids daily (6-8 glasses).  Avoid fast food or heavy meals, as your are more likely to get nauseated.  Eat a low fat diet the next few days after surgery.  Limit caffeine intake to 1-2 servings a day. 3. PAIN CONTROL: a. Pain is best controlled by a usual combination of several different methods TOGETHER: i. Muscle relaxation: Soak in a warm bath (or Sitz bath) three times a day and after bowel movements.  Continue to do this until all pain is resolved. ii. Over the counter pain medication iii. Prescription pain medication b. Most patients will experience some swelling and discomfort in the anus/rectal area and incisions.  Heat such as warm towels, sitz baths, warm baths, etc to help relax tight/sore spots and speed recovery.  Some people prefer to use ice, especially in the first couple days after surgery, as it may decrease the pain and swelling, or alternate between ice & heat.  Experiment to what works for you.  Swelling and bruising can take several weeks to resolve.  Pain can take even longer to completely resolve. c. It is helpful to take an over-the-counter pain medication regularly for the first few weeks.  Choose one of the following that works best for you: i. Naproxen (Aleve, etc)  Two 220mg  tabs twice a day ii. Ibuprofen (Advil, etc) Three 200mg  tabs four times a day (every meal  & bedtime) d. A  prescription for pain medication (such as percocet, oxycodone, hydrocodone, etc) should be given to you upon discharge.  Take your pain medication as prescribed.  i. If you are having problems/concerns with the prescription medicine (does not control pain, nausea, vomiting, rash, itching, etc), please call [phone] to see if we need to switch you to a different pain medicine that will work better for you and/or control your side effect better. ii. If you need a refill on your pain medication, please contact your pharmacy.  They will contact our office to request authorization. Prescriptions will not be filled after 5 pm or on week-ends. 4. KEEP YOUR BOWELS REGULAR and AVOID CONSTIPATION a. The goal is one to two soft bowel movements a day.  You should at least have a bowel movement every other day. b. Avoid getting constipated.  Between the surgery and the pain medications, it is common to experience some constipation. This can be very painful after rectal surgery.  Increasing fluid intake and taking a fiber supplement (such as Metamucil, Citrucel, FiberCon, etc) 1-2 times a day regularly will usually help prevent this problem from occurring.  A stool softener like colace is also recommended.  This can be purchased over the counter at your pharmacy.  You can take it up to 3 times a day.  If you do not have a bowel movement after 24 hrs since your surgery, take one does of  milk of magnesia.  If you still haven't had a bowel movement 8-12 hours after that dose, take another dose.  If you don't have a bowel movement 48 hrs after surgery, purchase a Fleets enema from the drug store and administer gently per package instructions.  If you still are having trouble with your bowel movements after that, please call the office for further instructions. c. If you develop diarrhea or have many loose bowel movements, simplify your diet to bland foods & liquids for a few days.  Stop any stool  softeners and decrease your fiber supplement.  Switching to mild anti-diarrheal medications (Kayopectate, Pepto Bismol) can help.  If this worsens or does not improve, please call us.  5. Wound Care a. Remove your bandages before your first bowel movement or 8 hours after surgery.     b. Remove any wound packing material at this tim,e as well.  You do not need to repack the wound unless instructed otherwise.  Wear an absorbent pad or soft cotton gauze in your underwear to catch any drainage and help keep the area clean. You should change this every 2-3 hours while awake. c. Keep the area clean and dry.  Bathe / shower every day, especially after bowel movements.  Keep the area clean by showering / bathing over the incision / wound.   It is okay to soak an open wound to help wash it.  Wet wipes or showers / gentle washing after bowel movements is often less traumatic than regular toilet paper. d. Dennis Bast may have some styrofoam-like soft packing in the rectum which will come out with the first bowel movement.  e. You will often notice bleeding with bowel movements.  This should slow down by the end of the first week of surgery f. Expect some drainage.  This should slow down, too, by the end of the first week of surgery.  Wear an absorbent pad or soft cotton gauze in your underwear until the drainage stops. g. Do Not sit on a rubber or pillow ring.  This can make you symptoms worse.  You may sit on a soft pillow if needed.  6. ACTIVITIES as tolerated:   a. You may resume regular (light) daily activities beginning the next day--such as daily self-care, walking, climbing stairs--gradually increasing activities as tolerated.  If you can walk 30 minutes without difficulty, it is safe to try more intense activity such as jogging, treadmill, bicycling, low-impact aerobics, swimming, etc. b. Save the most intensive and strenuous activity for last such as sit-ups, heavy lifting, contact sports, etc  Refrain from any  heavy lifting or straining until you are off narcotics for pain control.   c. You may drive when you are no longer taking prescription pain medication, you can comfortably sit for long periods of time, and you can safely maneuver your car and apply brakes. d. Dennis Bast may have sexual intercourse when it is comfortable.  7. FOLLOW UP in our office a. Please call [facility] at [phone] to set up an appointment to see your surgeon in the office for a follow-up appointment approximately 3-4 weeks after your surgery. b. Make sure that you call for this appointment the day you arrive home to insure a convenient appointment time. 10. IF YOU HAVE DISABILITY OR FAMILY LEAVE FORMS, BRING THEM TO THE OFFICE FOR PROCESSING.  DO NOT GIVE THEM TO YOUR DOCTOR.     WHEN TO CALL us [phone]: 1. Poor pain control 2. Reactions / problems  with new medications (rash/itching, nausea, etc)  3. Fever over 101.5 F (38.5 C) 4. Inability to urinate 5. Nausea and/or vomiting 6. Worsening swelling or bruising 7. Continued bleeding from incision. 8. Increased pain, redness, or drainage from the incision  The clinic staff is available to answer your questions during regular business hours (8:30am-5pm).  Please don't hesitate to call and ask to speak to one of our nurses for clinical concerns.   A surgeon from [facility] is always on call at the hospitals   If you have a medical emergency, go to the nearest emergency room or call 911.    [facility], [place], [place], [place], [place]  [address] ? MAIN: [phone] ? TOLL FREE: [phone] ? FAX [phone] [email or web address]    Post Anesthesia Home Care Instructions  Activity: Get plenty of rest for the remainder of the day. A responsible individual must stay with you for 24 hours following the procedure.  For the next 24 hours, DO NOT: -Drive a car -Paediatric nurse -Drink alcoholic  beverages -Take any medication unless instructed by your physician -Make any legal decisions or sign important papers.  Meals: Start with liquid foods such as gelatin or soup. Progress to regular foods as tolerated. Avoid greasy, spicy, heavy foods. If nausea and/or vomiting occur, drink only clear liquids until the nausea and/or vomiting subsides. Call your physician if vomiting continues.  Special Instructions/Symptoms: Your throat may feel dry or sore from the anesthesia or the breathing tube placed in your throat during surgery. If this causes discomfort, gargle with warm salt water. The discomfort should disappear within 24 hours.  If you had a scopolamine patch placed behind your ear for the management of post- operative nausea and/or vomiting:  1. The medication in the patch is effective for 72 hours, after which it should be removed.  Wrap patch in a tissue and discard in the trash. Wash hands thoroughly with soap and water. 2. You may remove the patch earlier than 72 hours if you experience unpleasant side effects which may include dry mouth, dizziness or visual disturbances. 3. Avoid touching the patch. Wash your hands with soap and water after contact with the patch.

## 2020-02-17 NOTE — Transfer of Care (Signed)
Immediate Anesthesia Transfer of Care Note  Patient: Mandy Wilson  Procedure(s) Performed: ANAL EXAM UNDER ANESTHESIA (N/A Rectum) CHEMICAL SPHINCTEROTOMY BOTOX (N/A Rectum)  Patient Location: PACU  Anesthesia Type:MAC  Level of Consciousness: sedated  Airway & Oxygen Therapy: Patient Spontanous Breathing and Patient connected to nasal cannula oxygen  Post-op Assessment: Report given to RN  Post vital signs: Reviewed and stable  Last Vitals: 106/64 Vitals Value Taken Time  BP    Temp    Pulse 79 02/17/20 1405  Resp 17 02/17/20 1405  SpO2 100 % 02/17/20 1405  Vitals shown include unvalidated device data.  Last Pain:  Vitals:   02/17/20 1247  TempSrc: Oral  PainSc: 3       Patients Stated Pain Goal: 3 (36/68/15 9470)  Complications: No apparent anesthesia complications

## 2020-02-19 NOTE — Anesthesia Postprocedure Evaluation (Signed)
Anesthesia Post Note  Patient: Mandy Wilson  Procedure(s) Performed: ANAL EXAM UNDER ANESTHESIA (N/A Rectum) CHEMICAL SPHINCTEROTOMY BOTOX, HEMORRHOIDPEXY (N/A Rectum)     Patient location during evaluation: PACU Anesthesia Type: MAC Level of consciousness: awake Pain management: pain level controlled Vital Signs Assessment: post-procedure vital signs reviewed and stable Respiratory status: spontaneous breathing Cardiovascular status: stable Postop Assessment: no apparent nausea or vomiting Anesthetic complications: no    Last Vitals:  Vitals:   02/17/20 1445 02/17/20 1534  BP: 105/77 (!) 134/99  Pulse: 70 72  Resp: 14 16  Temp:  36.9 C  SpO2: 100% 100%    Last Pain:  Vitals:   02/17/20 1534  TempSrc: Oral  PainSc:    Pain Goal: Patients Stated Pain Goal: 3 (02/17/20 1247)                 Huston Foley

## 2020-02-20 ENCOUNTER — Emergency Department (HOSPITAL_COMMUNITY): Payer: Medicaid Other

## 2020-02-20 ENCOUNTER — Emergency Department (HOSPITAL_COMMUNITY)
Admission: EM | Admit: 2020-02-20 | Discharge: 2020-02-20 | Disposition: A | Discharge status: Home / Self Care | Payer: Medicaid Other | Attending: Emergency Medicine | Admitting: Emergency Medicine

## 2020-02-20 ENCOUNTER — Other Ambulatory Visit: Payer: Self-pay

## 2020-02-20 DIAGNOSIS — R0789 Other chest pain: Secondary | ICD-10-CM | POA: Insufficient documentation

## 2020-02-20 DIAGNOSIS — Z5321 Procedure and treatment not carried out due to patient leaving prior to being seen by health care provider: Secondary | ICD-10-CM | POA: Insufficient documentation
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
## 2020-02-21 ENCOUNTER — Emergency Department (HOSPITAL_COMMUNITY): Payer: Medicaid Other

## 2020-02-21 ENCOUNTER — Emergency Department (HOSPITAL_COMMUNITY)
Admission: EM | Admit: 2020-02-21 | Discharge: 2020-02-21 | Disposition: A | Discharge status: Home / Self Care | Payer: Medicaid Other | Attending: Emergency Medicine | Admitting: Emergency Medicine

## 2020-02-21 DIAGNOSIS — Z5321 Procedure and treatment not carried out due to patient leaving prior to being seen by health care provider: Secondary | ICD-10-CM | POA: Insufficient documentation

## 2020-02-21 DIAGNOSIS — K6289 Other specified diseases of anus and rectum: Secondary | ICD-10-CM

## 2020-02-21 LAB — LACTIC ACID, PLASMA: Lactic Acid, Venous: 1.2 mmol/L (ref 0.5–1.9)

## 2020-02-21 LAB — CBC WITH DIFFERENTIAL/PLATELET
Abs Immature Granulocytes: 0.02 10*3/uL (ref 0.00–0.07)
Basophils Absolute: 0 10*3/uL (ref 0.0–0.1)
Basophils Relative: 1 %
Eosinophils Absolute: 0.1 10*3/uL (ref 0.0–0.5)
Eosinophils Relative: 2 %
HCT: 37.1 % (ref 36.0–46.0)
Hemoglobin: 11.9 g/dL — ABNORMAL LOW (ref 12.0–15.0)
Immature Granulocytes: 0 %
Lymphocytes Relative: 19 %
Lymphs Abs: 1.5 10*3/uL (ref 0.7–4.0)
MCH: 30.3 pg (ref 26.0–34.0)
MCHC: 32.1 g/dL (ref 30.0–36.0)
MCV: 94.4 fL (ref 80.0–100.0)
Monocytes Absolute: 0.7 10*3/uL (ref 0.1–1.0)
Monocytes Relative: 8 %
Neutro Abs: 5.6 10*3/uL (ref 1.7–7.7)
Neutrophils Relative %: 70 %
Platelets: 231 10*3/uL (ref 150–400)
RBC: 3.93 MIL/uL (ref 3.87–5.11)
RDW: 14 % (ref 11.5–15.5)
WBC: 8 10*3/uL (ref 4.0–10.5)
nRBC: 0 % (ref 0.0–0.2)

## 2020-02-21 LAB — BASIC METABOLIC PANEL
Anion gap: 6 (ref 5–15)
BUN: 10 mg/dL (ref 6–20)
CO2: 25 mmol/L (ref 22–32)
Calcium: 8.5 mg/dL — ABNORMAL LOW (ref 8.9–10.3)
Chloride: 108 mmol/L (ref 98–111)
Creatinine, Ser: 0.57 mg/dL (ref 0.44–1.00)
GFR calc Af Amer: >60 mL/min (ref >60)
GFR calc non Af Amer: >60 mL/min (ref >60)
Glucose, Bld: 71 mg/dL (ref 70–99)
Potassium: 3.8 mmol/L (ref 3.5–5.1)
Sodium: 139 mmol/L (ref 135–145)

## 2020-02-21 MED ORDER — HYDROCORT-PRAMOXINE (PERIANAL) 1-1 % EX FOAM: 1.0000 | Freq: Two times a day (BID) | CUTANEOUS | 0 refills | Status: DC

## 2020-02-21 MED ORDER — GLYCERIN (ADULT) 2 G RE SUPP
1.0000 | RECTAL | 0 refills | Status: DC | PRN
Start: 2020-02-21 — End: 2020-04-18

## 2020-02-21 MED ORDER — MORPHINE SULFATE (PF) 4 MG/ML IV SOLN
4.0000 mg | Freq: Once | INTRAVENOUS | Status: AC
  Administered 2020-02-21: 4 mg via INTRAVENOUS
  Filled 2020-02-21: qty 1

## 2020-02-21 MED ORDER — TRAMADOL HCL 50 MG PO TABS: ORAL_TABLET | ORAL | 0 refills | Status: DC

## 2020-02-21 NOTE — ED Provider Notes (Signed)
[facility] DEPT Provider Note   CSN: [id number] Arrival date & time: 02/21/20  1003     History Chief Complaint  Patient presents with  . Rectal Pain    Mandy Wilson is a 34 y.o. female.  HPI Patient had anal exam under anesthesia and chemical sphincterotomy with Botox [phone].  Dr. Marcello Moores noted sphincter hypertension and a posterior midline fissure along with 2 relatively large internal hemorrhoids.  Patient reports that she always has difficulty having a bowel movement.  She reports that she has not passed a stool in 2 days.  She is having a lot of anal pressure and pain.  No abdominal pain.  No fever no vomiting.  Past Medical History:  Diagnosis Date  . Anal fissure   . Bipolar 1 disorder ([place])   . Depression   . Rash    breasts and stomach for last month [facility]     Patient Active Problem List   Diagnosis Date Noted  . 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 CESAREAN SECTION     X3  . OPEN REDUCTION INTERNAL FIXATION (ORIF) DISTAL PHALANX Right 09/02/2019   Procedure: OPEN REDUCTION INTERNAL FIXATION (ORIF) right long finger malunion repair;  Surgeon: Iran Planas, MD;  Location: [facility];  Service: Orthopedics;  Laterality: Right;  . RECTAL EXAM UNDER ANESTHESIA N/A 02/17/2020   Procedure: ANAL EXAM UNDER ANESTHESIA;  Surgeon: Leighton Ruff, MD;  Location: [facility];  Service: General;  Laterality: N/A;  . SPHINCTEROTOMY N/A 02/17/2020   Procedure: CHEMICAL SPHINCTEROTOMY BOTOX, HEMORRHOIDPEXY;  Surgeon: Leighton Ruff, MD;  Location: [facility];  Service: General;  Laterality: N/A;     OB History    Gravida  1   Para      Term      Preterm      AB      Living        SAB      TAB      Ectopic      Multiple      Live  Births              Family History  Problem Relation Age of Onset  . Asthma Mother   . Asthma Father     Social History   Tobacco Use  . Smoking status: Current Every Day Smoker    Packs/day: 0.50    Years: 12.00    Pack years: 6.00    Types: Cigarettes  . Smokeless tobacco: Never Used  Substance Use Topics  . Alcohol use: Yes    Comment: pint some days not every day, social  . Drug use: Yes    Types: Marijuana, "Crack" cocaine    Comment: cocaine last use 02-09-2020, marijuana use every day    Home Medications Prior to Admission medications   Medication Sig Start Date End Date Taking? Authorizing Provider  acetaminophen (TYLENOL) 500 MG tablet Take 1 tablet (500 mg total) by mouth every 6 (six) hours as needed. 12/23/19  Yes Fawze, Mina A, PA-C  benzocaine (AMERICAINE) 20 % rectal ointment Place rectally every 3 (three) hours as needed for pain. 12/23/19  Yes Fawze, Mina A, PA-C  docusate sodium (COLACE) 100 MG capsule Take 1 capsule (100 mg total) by mouth every 12 (twelve) hours. 12/23/19  Yes Renita Papa,  PA-C  HYDROcodone-acetaminophen (NORCO/VICODIN) 5-325 MG tablet Take 1-2 tablets by mouth every 6 (six) hours as needed. 02/17/20  Yes Romie Levee, MD  ibuprofen (ADVIL) 200 MG tablet Take 1,200 mg by mouth every 6 (six) hours as needed for moderate pain.   Yes [provider]  levonorgestrel (MIRENA) 20 MCG/24HR IUD 1 each by Intrauterine route once. Inserted 2018   Yes Alver Fisher, RN  naproxen (NAPROSYN) 375 MG tablet Take 1 tablet (375 mg total) by mouth 2 (two) times daily as needed for moderate pain. 02/12/20  Yes Patel, Shalyn, PA-C  phenylephrine (,USE FOR PREPARATION-H,) 0.25 % suppository Place 1 suppository rectally 2 (two) times daily. 12/23/19  Yes Fawze, Mina A, PA-C  QUEtiapine (SEROQUEL) 50 MG tablet Take 50 mg by mouth 2 (two) times daily.    Yes Alver Fisher, RN  glycerin adult 2 g suppository Place 1 suppository rectally as needed for  constipation. 02/21/20   Arby Barrette, MD    Allergies    Shellfish allergy  Review of Systems   Review of Systems 10 systems reviewed and negative except as per HPI. Physical Exam Updated Vital Signs BP 131/86   Pulse 63   Temp 98.6 F (37 C) (Oral)   Resp 18   Ht 5\' 4"  (1.626 m)   Wt 53.1 kg   LMP 01/23/2020 (Exact Date) Comment: implant  SpO2 100%   BMI 20.08 kg/m   Physical Exam Constitutional:      Comments: Alert nontoxic.  Clinically well in appearance.  HENT:     Mouth/Throat:     Mouth: Mucous membranes are dry.  Cardiovascular:     Rate and Rhythm: Normal rate and regular rhythm.  Pulmonary:     Effort: Pulmonary effort is normal.     Breath sounds: Normal breath sounds.  Abdominal:     General: There is no distension.     Palpations: Abdomen is soft.     Tenderness: There is no abdominal tenderness. There is no guarding.  Genitourinary:    Comments: Perianal visual exam shows nonthrombosed, nonerythematous hemorrhoidal tissues.  Benign in appearance.  About 2 cm in length.  Patient does not tolerate digital rectal exam.  I have tried with gradual steady pressure but she will not tolerate normal digital exam. Musculoskeletal:        General: No swelling. Normal range of motion.     Right lower leg: No edema.     Left lower leg: No edema.  Skin:    General: Skin is warm and dry.  Neurological:     General: No focal deficit present.     Mental Status: She is oriented to person, place, and time.     Coordination: Coordination normal.     ED Results / Procedures / Treatments   Labs (all labs ordered are listed, but only abnormal results are displayed) Labs Reviewed  BASIC METABOLIC PANEL - Abnormal; Notable for the following components:      Result Value   Calcium 8.5 (*)    All other components within normal limits  CBC WITH DIFFERENTIAL/PLATELET - Abnormal; Notable for the following components:   Hemoglobin 11.9 (*)    All other components  within normal limits  LACTIC ACID, PLASMA  LACTIC ACID, PLASMA  URINALYSIS, ROUTINE W REFLEX MICROSCOPIC    EKG None  Radiology DG Abd Acute W/Chest  Result Date: 02/21/2020 CLINICAL DATA:  Patient came in via EMS with reports of rectal pain of 10/10, passing blood  clots in stool, no bowel movement in three days. Patient had hemorrhoidectomy Wednesday. EXAM: DG ABDOMEN ACUTE W/ 1V CHEST COMPARISON:  None. FINDINGS: Normal mediastinum and cardiac silhouette. Normal pulmonary vasculature. No evidence of effusion, infiltrate, or pneumothorax. No acute bony abnormality. Mild scoliosis. No dilated loops of large or small bowel. Gas and stool in the rectum. No pathologic calcifications. No organomegaly. No acute osseous abnormality IUD in expected location. IMPRESSION: 1. Normal chest radiograph. 2. No acute abdominal findings identified. 3. Mild scoliosis. Electronically Signed   By: Genevive Bi M.D.   On: 02/21/2020 12:03    Procedures Procedures (including critical care time)  Medications Ordered in ED Medications  morphine 4 MG/ML injection 4 mg (4 mg Intravenous Given 02/21/20 1126)    ED Course  I have reviewed the triage vital signs and the nursing notes.  Pertinent labs & imaging results that were available during my care of the patient were reviewed by me and considered in my medical decision making (see chart for details).    MDM Rules/Calculators/A&P                     Consult: Reviewed with Dr. Maisie Fus.  Recommends continue symptomatic treatment.  Patient exam does not show any significant perirectal findings.  No signs of obstruction.  Patient is otherwise clinically well.  Will increase rectal symptomatic treatment with glycerin suppositories and Proctofoam.  Tramadol as needed for pain control. Final Clinical Impression(s) / ED Diagnoses Final diagnoses:  Rectal pain    Rx / DC Orders ED Discharge Orders         Ordered    glycerin adult 2 g suppository  As  needed     02/21/20 1324           Arby Barrette, MD 03/03/20 0000

## 2020-02-21 NOTE — ED Notes (Signed)
Pt no longer visualized within ED, or in pt room.

## 2020-02-21 NOTE — ED Notes (Signed)
Patient provided with gingerale per request.

## 2020-02-21 NOTE — ED Triage Notes (Addendum)
Patient comes in via EMS with reports of rectal pain of 10/10, passing blood clots in stool, no bowel movement in three days. Patient had hemorrhoidectomy Wednesday. En route patient received  total fentanyl , last dose was at 0958 -

## 2020-02-21 NOTE — ED Notes (Signed)
Bladder scan 21mL

## 2020-02-21 NOTE — Discharge Instructions (Addendum)
1.  Follow discharge instructions per Dr. Maisie Fus.  You may also use glycerin suppositories as prescribed.  These are to help pass stool and lubricate the rectum. 2.  Eat a high fiber low-fat diet to keep stool soft and easily passable.  Stay hydrated. 3.  Follow-up with Dr. Maisie Fus as planned.

## 2020-02-21 NOTE — ED Provider Notes (Signed)
[facility] DEPT Provider Note   CSN: [id number] Arrival date & time: 02/21/20  1003     History Chief Complaint  Patient presents with  . Rectal Pain    Mandy Wilson is a 34 y.o. female.  HPI Redundant open note.  See other complete dictated note.    Past Medical History:  Diagnosis Date  . Anal fissure   . Bipolar 1 disorder ([place])   . Depression   . Rash    breasts and stomach for last month [facility]     Patient Active Problem List   Diagnosis Date Noted  . 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 CESAREAN SECTION     X3  . OPEN REDUCTION INTERNAL FIXATION (ORIF) DISTAL PHALANX Right 09/02/2019   Procedure: OPEN REDUCTION INTERNAL FIXATION (ORIF) right long finger malunion repair;  Surgeon: Iran Planas, MD;  Location: [facility];  Service: Orthopedics;  Laterality: Right;  . RECTAL EXAM UNDER ANESTHESIA N/A 02/17/2020   Procedure: ANAL EXAM UNDER ANESTHESIA;  Surgeon: Leighton Ruff, MD;  Location: [facility];  Service: General;  Laterality: N/A;  . SPHINCTEROTOMY N/A 02/17/2020   Procedure: CHEMICAL SPHINCTEROTOMY BOTOX, HEMORRHOIDPEXY;  Surgeon: Leighton Ruff, MD;  Location: [facility];  Service: General;  Laterality: N/A;     OB History    Gravida  1   Para      Term      Preterm      AB      Living        SAB      TAB      Ectopic      Multiple      Live Births              Family History  Problem Relation Age of Onset  . Asthma Mother   . Asthma Father     Social History   Tobacco Use  . Smoking status: Current Every Day Smoker    Packs/day: 0.50    Years: 12.00    Pack years: 6.00    Types: Cigarettes  . Smokeless tobacco: Never Used  Substance Use Topics  . Alcohol use: Yes    Comment: pint some  days not every day, social  . Drug use: Yes    Types: Marijuana, "Crack" cocaine    Comment: cocaine last use 02-09-2020, marijuana use every day    Home Medications Prior to Admission medications   Medication Sig Start Date End Date Taking? Authorizing Provider  acetaminophen (TYLENOL) 500 MG tablet Take 1 tablet (500 mg total) by mouth every 6 (six) hours as needed. 12/23/19  Yes Fawze, Mina A, PA-C  benzocaine (AMERICAINE) 20 % rectal ointment Place rectally every 3 (three) hours as needed for pain. 12/23/19  Yes Fawze, Mina A, PA-C  docusate sodium (COLACE) 100 MG capsule Take 1 capsule (100 mg total) by mouth every 12 (twelve) hours. 12/23/19  Yes Fawze, Mina A, PA-C  HYDROcodone-acetaminophen (NORCO/VICODIN) 5-325 MG tablet Take 1-2 tablets by mouth every 6 (six) hours as needed. 0/94/70  Yes Leighton Ruff, MD  ibuprofen (ADVIL) 200 MG tablet Take 1,200 mg by mouth every 6 (six) hours as needed for moderate pain.   Yes [provider]  levonorgestrel (MIRENA) 20 MCG/24HR IUD 1 each by Intrauterine route once. Inserted 2018  Yes Alver Fisher, RN  naproxen (NAPROSYN) 375 MG tablet Take 1 tablet (375 mg total) by mouth 2 (two) times daily as needed for moderate pain. 02/12/20  Yes Patel, Shalyn, PA-C  phenylephrine (,USE FOR PREPARATION-H,) 0.25 % suppository Place 1 suppository rectally 2 (two) times daily. 12/23/19  Yes Fawze, Mina A, PA-C  QUEtiapine (SEROQUEL) 50 MG tablet Take 50 mg by mouth 2 (two) times daily.    Yes Alver Fisher, RN    Allergies    Shellfish allergy  Review of Systems   Review of Systems  Physical Exam Updated Vital Signs BP 130/89   Pulse 68   Temp 98.6 F (37 C) (Oral)   Resp 18   Ht 5\' 4"  (1.626 m)   Wt 53.1 kg   LMP 01/23/2020 (Exact Date) Comment: implant  SpO2 100%   BMI 20.08 kg/m   Physical Exam  ED Results / Procedures / Treatments   Labs (all labs ordered are listed, but only abnormal results are displayed) Labs Reviewed -  No data to display  EKG None  Radiology No results found.  Procedures Procedures (including critical care time)  Medications Ordered in ED Medications - No data to display  ED Course  I have reviewed the triage vital signs and the nursing notes.  Pertinent labs & imaging results that were available during my care of the patient were reviewed by me and considered in my medical decision making (see chart for details).    MDM Rules/Calculators/A&P                      Final Clinical Impression(s) / ED Diagnoses Final diagnoses:  None    Rx / DC Orders ED Discharge Orders    None       03/24/2020, MD 03/03/20 0002

## 2020-04-18 ENCOUNTER — Emergency Department (HOSPITAL_COMMUNITY)
Admission: EM | Admit: 2020-04-18 | Discharge: 2020-04-18 | Discharge status: Against Medical Advice | Payer: Medicaid Other | Attending: Emergency Medicine | Admitting: Emergency Medicine

## 2020-04-18 ENCOUNTER — Encounter (HOSPITAL_COMMUNITY): Payer: Self-pay | Admitting: Emergency Medicine

## 2020-04-18 DIAGNOSIS — K6289 Other specified diseases of anus and rectum: Secondary | ICD-10-CM | POA: Diagnosis present

## 2020-04-18 DIAGNOSIS — Z9689 Presence of other specified functional implants: Secondary | ICD-10-CM | POA: Insufficient documentation

## 2020-04-18 DIAGNOSIS — Z79899 Other long term (current) drug therapy: Secondary | ICD-10-CM | POA: Insufficient documentation

## 2020-04-18 DIAGNOSIS — K602 Anal fissure, unspecified: Secondary | ICD-10-CM | POA: Insufficient documentation

## 2020-04-18 DIAGNOSIS — F17210 Nicotine dependence, cigarettes, uncomplicated: Secondary | ICD-10-CM | POA: Diagnosis not present

## 2020-04-18 DIAGNOSIS — F1590 Other stimulant use, unspecified, uncomplicated: Secondary | ICD-10-CM | POA: Diagnosis not present

## 2020-04-18 DIAGNOSIS — K649 Unspecified hemorrhoids: Secondary | ICD-10-CM | POA: Insufficient documentation

## 2020-04-18 MED ORDER — HYDROCODONE-ACETAMINOPHEN 5-325 MG PO TABS: 1.0000 | ORAL_TABLET | ORAL | 0 refills | Status: DC | PRN

## 2020-04-18 MED ORDER — HYDROCORTISONE 1 % EX CREA
TOPICAL_CREAM | CUTANEOUS | 0 refills | Status: DC
Start: 2020-04-18 — End: 2020-05-25

## 2020-04-18 MED ORDER — HYDROCODONE-ACETAMINOPHEN 5-325 MG PO TABS
1.0000 | ORAL_TABLET | Freq: Once | ORAL | Status: DC
  Filled 2020-04-18: qty 1

## 2020-04-18 MED ORDER — GLYCERIN (ADULT) 2 G RE SUPP: 1.0000 | RECTAL | 0 refills | Status: DC | PRN

## 2020-04-18 MED ORDER — KETOROLAC TROMETHAMINE 30 MG/ML IJ SOLN
30.0000 mg | Freq: Once | INTRAMUSCULAR | Status: DC
  Filled 2020-04-18: qty 1

## 2020-04-18 NOTE — ED Notes (Signed)
Pt not in room. One staff member did see her go to restrooms. Looked in restrooms. Pt appears to have left. EDP notified.

## 2020-04-18 NOTE — ED Notes (Signed)
Assisted PA with rectal exam

## 2020-04-18 NOTE — Discharge Instructions (Signed)
Follow-up with [facility] whom you have seen previously for your hemorrhoids.  Take the medications as prescribed. Return for new or worsening symptoms

## 2020-04-18 NOTE — ED Triage Notes (Addendum)
Pt reports rectal pain and bleeding X2 days.  Hx of anal fissures.

## 2020-04-18 NOTE — ED Provider Notes (Signed)
[facility] EMERGENCY DEPARTMENT Provider Note   CSN: [id number] Arrival date & time: 04/18/20  1448    History Chief Complaint  Patient presents with   Rectal Pain    Mandy Wilson is a 34 y.o. female with past medical history significant for anal fissures, bipolar who presents for evaluation of rectal pain and bleeding.  Began 3 days ago.  History of anal fissures, hemmorhoids and s/p sphincterotomy May 2021.  Has been trying Metamucil, Preparation H without relief.  Patient states she feels pressure at her rectal area when she has bowel movements.  Rates her pain a 10/10.  States that when she has a bowel movement she feels something "pop out" and then will gradually decrease in size and pain.  States initially her pain was a 10/10 when she got here however she feels like her swelling has resolved and her pain is currently a 7/10 which she states that she feels constantly. BRBPR with bowel movements when whipping however not in stool. No associated abd pain. Denies fever, chills, nausea, vomiting, chest pain, shortness of breath, abdominal pain, diarrhea, dysuria.  Denies aggravating or relieving factors.  States pain similar to her prior hemorrhoids and rectal fissures.  History obtained from patient and past medical records. No interpretor was used.  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    Past Surgical History:  Procedure Laterality Date   CESAREAN SECTION     X3   OPEN REDUCTION INTERNAL FIXATION (ORIF) DISTAL PHALANX Right 09/02/2019   Procedure: OPEN REDUCTION INTERNAL FIXATION (ORIF) right long finger malunion repair;  Surgeon: Bradly Bienenstock, MD;  Location: [facility];  Service: Orthopedics;  Laterality: Right;   RECTAL EXAM UNDER ANESTHESIA N/A 02/17/2020   Procedure: ANAL EXAM UNDER ANESTHESIA;  Surgeon: Romie Levee, MD;  Location: [facility];  Service: General;  Laterality: N/A;   SPHINCTEROTOMY N/A 02/17/2020   Procedure: CHEMICAL SPHINCTEROTOMY BOTOX, HEMORRHOIDPEXY;  Surgeon: Romie Levee, MD;  Location: [facility];  Service: General;  Laterality: N/A;     OB History    Gravida  1   Para      Term      Preterm      AB      Living        SAB      TAB      Ectopic      Multiple      Live Births              Family History  Problem Relation Age of Onset   Asthma Mother    Asthma Father     Social History   Tobacco Use   Smoking status: Current Every Day Smoker    Packs/day: 0.50    Years: 12.00    Pack years: 6.00    Types: Cigarettes   Smokeless tobacco: Never Used  Vaping Use   Vaping Use: Former  Substance Use Topics   Alcohol use: Yes    Comment: pint some days not every day, social   Drug use: Yes  Types: Marijuana, "Crack" cocaine    Comment: cocaine last use 02-09-2020, marijuana use every day    Home Medications Prior to Admission medications   Medication Sig Start Date End Date Taking? Authorizing Provider  ibuprofen (ADVIL) 200 MG tablet Take 1,200 mg by mouth every 6 (six) hours as needed for moderate pain.   Yes [provider]  levonorgestrel (MIRENA) 20 MCG/24HR IUD 1 each by Intrauterine route once. Inserted 2018   Yes Alver FisherLindner, Jodi N, RN  naproxen (NAPROSYN) 375 MG tablet Take 1 tablet (375 mg total) by mouth 2 (two) times daily as needed for moderate pain. 02/12/20  Yes Patel, Shalyn, PA-C  QUEtiapine (SEROQUEL) 50 MG tablet Take 50 mg by mouth 2 (two) times daily.    Yes Alver FisherLindner, Jodi N, RN  traMADol Janean Sark(ULTRAM) 50 MG tablet 1-2 tablets every 6 hours as needed for pain 02/21/20  Yes Pfeiffer,  Lebron ConnersMarcy, MD  acetaminophen (TYLENOL) 500 MG tablet Take 1 tablet (500 mg total) by mouth every 6 (six) hours as needed. Patient not taking: Reported on 04/18/2020 12/23/19   Michela PitcherFawze, Mina A, PA-C  benzocaine (AMERICAINE) 20 % rectal ointment Place rectally every 3 (three) hours as needed for pain. Patient not taking: Reported on 04/18/2020 12/23/19   Michela PitcherFawze, Mina A, PA-C  docusate sodium (COLACE) 100 MG capsule Take 1 capsule (100 mg total) by mouth every 12 (twelve) hours. Patient not taking: Reported on 04/18/2020 12/23/19   Michela PitcherFawze, Mina A, PA-C  glycerin adult 2 g suppository Place 1 suppository rectally as needed for constipation. 04/18/20   Indiah Heyden A, PA-C  HYDROcodone-acetaminophen (NORCO/VICODIN) 5-325 MG tablet Take 1 tablet by mouth every 4 (four) hours as needed. 04/18/20   Javani Spratt A, PA-C  hydrocortisone cream 1 % Apply to affected area 2 times daily 04/18/20   Rashika Bettes A, PA-C  hydrocortisone-pramoxine (PROCTOFOAM-HC) rectal foam Place 1 applicator rectally 2 (two) times daily. Patient not taking: Reported on 04/18/2020 02/21/20   Arby BarrettePfeiffer, Marcy, MD  phenylephrine (,USE FOR PREPARATION-H,) 0.25 % suppository Place 1 suppository rectally 2 (two) times daily. Patient not taking: Reported on 04/18/2020 12/23/19   Michela PitcherFawze, Mina A, PA-C    Allergies    Shellfish allergy  Review of Systems   Review of Systems  Constitutional: Negative.   HENT: Negative.   Respiratory: Negative.   Cardiovascular: Negative.   Gastrointestinal: Positive for anal bleeding, constipation and rectal pain. Negative for abdominal distention, abdominal pain, blood in stool, diarrhea, nausea and vomiting.  Genitourinary: Negative.   Musculoskeletal: Negative.   Skin: Negative.   Neurological: Negative.   All other systems reviewed and are negative.  Physical Exam Updated Vital Signs BP (!) 126/94 (BP Location: Left Arm)    Pulse 54    Temp 98.2 F (36.8 C) (Oral)    Resp 13    Ht 5\' 4"  (1.626 m)     Wt 54.4 kg    SpO2 98%    BMI 20.60 kg/m   Physical Exam Vitals and nursing note reviewed. Exam conducted with a chaperone present.  Constitutional:      General: She is not in acute distress.    Appearance: She is well-developed. She is not ill-appearing, toxic-appearing or diaphoretic.  HENT:     Head: Normocephalic and atraumatic.     Nose: Nose normal.     Mouth/Throat:     Mouth: Mucous membranes are moist.  Eyes:     Pupils: Pupils are equal, round, and reactive to light.  Cardiovascular:  Rate and Rhythm: Normal rate.     Pulses: Normal pulses.     Heart sounds: Normal heart sounds.  Pulmonary:     Effort: Pulmonary effort is normal. No respiratory distress.     Breath sounds: Normal breath sounds.  Abdominal:     General: Bowel sounds are normal. There is no distension.     Tenderness: There is no abdominal tenderness. There is no right CVA tenderness, left CVA tenderness, guarding or rebound.  Genitourinary:    Rectum: Tenderness, external hemorrhoid and internal hemorrhoid present. No mass or anal fissure.     Comments: No fluctuance, induration, erythema, tenderness to perirectal area.  She has #3 nonthrombosed hemorrhoids and #1 thrombosed hemorrhoid which is tender to palpation.  She has internal hemorrhoids.  No stool in rectal vault.  No obvious anal fissures.  No impacted stool however difficult to assess 2/2 pain.  No evidence of prolapse per rectum. Musculoskeletal:        General: Normal range of motion.     Cervical back: Normal range of motion.  Skin:    General: Skin is warm and dry.     Capillary Refill: Capillary refill takes less than 2 seconds.  Neurological:     General: No focal deficit present.     Mental Status: She is alert and oriented to person, place, and time.     ED Results / Procedures / Treatments   Labs (all labs ordered are listed, but only abnormal results are displayed) Labs Reviewed - No data to  display  EKG None  Radiology No results found.  Procedures Procedures (including critical care time)  Medications Ordered in ED Medications  ketorolac (TORADOL) 30 MG/ML injection 30 mg (has no administration in time range)  HYDROcodone-acetaminophen (NORCO/VICODIN) 5-325 MG per tablet 1 tablet (has no administration in time range)   ED Course  I have reviewed the triage vital signs and the nursing notes.  Pertinent labs & imaging results that were available during my care of the patient were reviewed by me and considered in my medical decision making (see chart for details).  34 patient with history of hemorrhoids, recurrent anal fissure, s/p sphincterotomy with Dr. Maisie Fus with [facility] May 2021 who presents for evaluation of rectal pain and bleeding.  Feels similar to prior history of hemorrhoids and anal fissures.  Unrelieved with OTC medications.  She has not contacted general surgery.  Rates her pain a 10/10.  Abdomen soft, nontender.  Heart and lungs clear.  She is afebrile, nonseptic, not ill-appearing.  GU exam shows nonthrombosed and thrombosed hemorrhoids which she does have some tenderness to her nonthrombosed hemorrhoid.  She also has internal hemorrhoids.  No evidence of prolapsed rectum.  She has no fluctuance, induration, erythema warmth or tenderness to surrounding perirectal area to suggest abscess, cellulitis.  No obvious anal fissures.  Patient states her pain actually improved when she returned to the room and no longer has pressure to her rectal area however has her "normal" pain.  Patient's bleeding with bowel movements likely due to her hemorrhoids.  Low suspicion for acute infectious process.  She unfortunately had long [facility] number whom she is followed previously for this.  Will provide symptomatic management.  Went to reassess patient and nursing states staff saw her go to the RR and has not been seen since.   Patient ELOPED from the ED.     MDM Rules/Calculators/A&P  Final Clinical Impression(s) / ED Diagnoses Final diagnoses:  Rectal pain  Hemorrhoids, unspecified hemorrhoid type    Rx / DC Orders ED Discharge Orders         Ordered    glycerin adult 2 g suppository  As needed     Discontinue  Reprint     04/18/20 1007    hydrocortisone cream 1 %     Discontinue  Reprint     04/18/20 1007    HYDROcodone-acetaminophen (NORCO/VICODIN) 5-325 MG tablet  Every 4 hours PRN     Discontinue  Reprint     04/18/20 1007           Devin Ganaway A, PA-C 04/18/20 1035    Mancel Bale, MD 04/18/20 1740

## 2020-05-12 ENCOUNTER — Ambulatory Visit: Payer: Self-pay | Admitting: General Surgery

## 2020-05-12 NOTE — H&P (View-Only) (Signed)
The patient is a 34 year old female who presents with hemorrhoids.34 year old female who presents to the office for evaluation of anal pain. She reports over the past 3-4 weeks, she has had increase in anal pain. She reports regular bowel habits up until this most recent episode. She has tried multiple over-the-counter remedies including fiber supplementation and laxatives. She continues to have sharp pain with bowel movements. She describes prolapsing tissue with bowel movements or increased abdominal pressure. She underwent EUA, chemical sphincterotomy with Botox for anal fissure, and right posterior hemorrhoid pexy on 02/20/20. She did not follow-up after surgery, since she was doing well. She then developed rectal bleeding, with bowel movements. She also complains of pain worsened bowel movements. She has been alternating with Metamucil and Citrucel daily as well as taking sea moss and stool softeners. She has about 1 soft bowel movement a day. However, when she has a bowel movement she states a large mass protrudes from her rectum and she occasionally has to push it back in. She was seen at [facility] ER earlier today with a possible thrombosed external hemorrhoid. She was given glycerin suppositories, hydrocortisone 1%, and hydrocodone. Denies fevers.   Problem List/Past Medical Romie Levee, MD; 05/12/2020 2:27 PM) NAUSEA (R11.0) INFLAMED INTERNAL HEMORRHOID (K64.8) ANAL FISSURE (K60.2)  Past Surgical History Romie Levee, MD; 05/12/2020 2:27 PM) Cesarean Section - Multiple Oral Surgery  Diagnostic Studies History Romie Levee, MD; 05/12/2020 2:27 PM) Colonoscopy never Mammogram 1-3 years ago Pap Smear 1-5 years ago  Allergies Laurette Schimke, RMA; 05/12/2020 2:17 PM) Shellfish Hives. Allergies Reconciled  Medication History Laurette Schimke, [place]; 05/12/2020 2:18 PM) Anusol-HC (Rectal) Specific strength unknown - Active. Lidocaine (5% Ointment, 1 (one)  External as needed, Taken starting 01/22/2020) Active. Tylenol (500MG  Capsule, Oral) Active. Americaine (20% Ointment, Rectal) Active. Colace (100MG  Capsule, Oral) Active. Naproxen (500MG  Tablet, Oral) Active. MiraLax (17GM Packet, Oral) Active. traMADol HCl (Oral) Specific strength unknown - Active. Ibuprofen (Oral) Specific strength unknown - Active. Mirena (52 MG) (20MCG/24HR IUD, Intrauterine) Active. Medications Reconciled  Social History , MD; 05/12/2020 2:27 PM) Alcohol use Occasional alcohol use. Caffeine use Carbonated beverages, Coffee, Tea. Illicit drug use Uses socially only. Tobacco use Current every day smoker.  Family History , MD; 05/12/2020 2:27 PM) Alcohol Abuse Mother. Colon Cancer Father. Depression Mother. Hypertension Mother. Migraine Headache Mother.  Pregnancy / Birth History 05/14/2020, MD; 05/12/2020 2:27 PM) Age at menarche 15 years. Contraceptive History Depo-provera, Intrauterine device, Oral contraceptives. Gravida 6 Irregular periods Length (months) of breastfeeding 12-24 Maternal age 48-20 Para 4  Other Problems 05/14/2020, MD; 05/12/2020 2:27 PM) Anxiety Disorder Back Pain Bladder Problems Depression Gastroesophageal Reflux Disease Hemorrhoids Migraine Headache     Review of Systems 19-38 MD; 05/12/2020 2:27 PM) General Not Present- Appetite Loss, Chills, Fatigue, Fever, Night Sweats, Weight Gain and Weight Loss. Skin Not Present- Change in Wart/Mole, Dryness, Hives, Jaundice, New Lesions, Non-Healing Wounds, Rash and Ulcer. HEENT Present- Wears glasses/contact lenses. Not Present- Earache, Hearing Loss, Hoarseness, Nose Bleed, Oral Ulcers, Ringing in the Ears, Seasonal Allergies, Sinus Pain, Sore Throat, Visual Disturbances and Yellow Eyes. Breast Not Present- Breast Mass, Breast Pain, Nipple Discharge and Skin Changes. Cardiovascular Not Present- Chest Pain,  Difficulty Breathing Lying Down, Leg Cramps, Palpitations, Rapid Heart Rate, Shortness of Breath and Swelling of Extremities. Gastrointestinal Present- Abdominal Pain, Constipation, Gets full quickly at meals, Hemorrhoids, Nausea and Rectal Pain. Not Present- Bloating, Bloody Stool, Change in Bowel Habits, Chronic diarrhea, Difficulty Swallowing, Excessive  gas, Indigestion and Vomiting. Female Genitourinary Present- Painful Urination and Pelvic Pain. Not Present- Frequency, Nocturia and Urgency. Musculoskeletal Present- Back Pain, Joint Pain and Joint Stiffness. Not Present- Muscle Pain, Muscle Weakness and Swelling of Extremities. Neurological Present- Fainting and Headaches. Not Present- Decreased Memory, Numbness, Seizures, Tingling, Tremor, Trouble walking and Weakness. Psychiatric Present- Anxiety, Bipolar, Depression and Fearful. Not Present- Change in Sleep Pattern and Frequent crying. Endocrine Present- Cold Intolerance. Not Present- Excessive Hunger, Hair Changes, Heat Intolerance, Hot flashes and New Diabetes. Hematology Present- Easy Bruising. Not Present- Blood Thinners, Excessive bleeding, Gland problems, HIV and Persistent Infections.  Vitals Misty Stanley Caldwell RMA; 05/12/2020 2:19 PM) 05/12/2020 2:18 PM Weight: 106 lb Height: 64in Body Surface Area: 1.49 m Body Mass Index: 18.19 kg/m  Temp.: 98.65F  Pulse: 121 (Regular)  P.OX: 98% (Room air) BP: 138/84(Sitting, Left Arm, Standard)    Physical Exam Romie Levee MD; 05/12/2020 2:28 PM)  General Mental Status-Alert. General Appearance-Cooperative.  Abdomen Palpation/Percussion Palpation and Percussion of the abdomen reveal - Soft and Non Tender.  Rectal Note: anal fissure, posterior midline    Assessment & Plan Romie Levee MD; 05/12/2020 2:26 PM)  ANAL FISSURE (K60.2) Impression: 34 year old female who presents to the office with continued anal pain. She is status post chemical sphincterotomy in May of  this year for similar symptoms. She reports that her symptoms resolved for some time but then recurred. She is taking a fiber supplement and stool softeners. On exam today, she appears to have a recurrent anal fissure. We discussed performing a lateral internal sphincterotomy. We discussed a risk of incontinence of approximately 20%. She is okay with this risk and would like to proceed with surgery.

## 2020-05-12 NOTE — H&P (Signed)
The patient is a 34 year old female who presents with hemorrhoids.34 year old female who presents to the office for evaluation of anal pain. She reports over the past 3-4 weeks, she has had increase in anal pain. She reports regular bowel habits up until this most recent episode. She has tried multiple over-the-counter remedies including fiber supplementation and laxatives. She continues to have sharp pain with bowel movements. She describes prolapsing tissue with bowel movements or increased abdominal pressure. She underwent EUA, chemical sphincterotomy with Botox for anal fissure, and right posterior hemorrhoid pexy on 02/20/20. She did not follow-up after surgery, since she was doing well. She then developed rectal bleeding, with bowel movements. She also complains of pain worsened bowel movements. She has been alternating with Metamucil and Citrucel daily as well as taking sea moss and stool softeners. She has about 1 soft bowel movement a day. However, when she has a bowel movement she states a large mass protrudes from her rectum and she occasionally has to push it back in. She was seen at [facility] ER earlier today with a possible thrombosed external hemorrhoid. She was given glycerin suppositories, hydrocortisone 1%, and hydrocodone. Denies fevers.   Problem List/Past Medical Romie Levee, MD; 05/12/2020 2:27 PM) NAUSEA (R11.0) INFLAMED INTERNAL HEMORRHOID (K64.8) ANAL FISSURE (K60.2)  Past Surgical History Romie Levee, MD; 05/12/2020 2:27 PM) Cesarean Section - Multiple Oral Surgery  Diagnostic Studies History Romie Levee, MD; 05/12/2020 2:27 PM) Colonoscopy never Mammogram 1-3 years ago Pap Smear 1-5 years ago  Allergies Laurette Schimke, RMA; 05/12/2020 2:17 PM) Shellfish Hives. Allergies Reconciled  Medication History Laurette Schimke, [place]; 05/12/2020 2:18 PM) Anusol-HC (Rectal) Specific strength unknown - Active. Lidocaine (5% Ointment, 1 (one)  External as needed, Taken starting 01/22/2020) Active. Tylenol (500MG  Capsule, Oral) Active. Americaine (20% Ointment, Rectal) Active. Colace (100MG  Capsule, Oral) Active. Naproxen (500MG  Tablet, Oral) Active. MiraLax (17GM Packet, Oral) Active. traMADol HCl (Oral) Specific strength unknown - Active. Ibuprofen (Oral) Specific strength unknown - Active. Mirena (52 MG) (20MCG/24HR IUD, Intrauterine) Active. Medications Reconciled  Social History , MD; 05/12/2020 2:27 PM) Alcohol use Occasional alcohol use. Caffeine use Carbonated beverages, Coffee, Tea. Illicit drug use Uses socially only. Tobacco use Current every day smoker.  Family History , MD; 05/12/2020 2:27 PM) Alcohol Abuse Mother. Colon Cancer Father. Depression Mother. Hypertension Mother. Migraine Headache Mother.  Pregnancy / Birth History 05/14/2020, MD; 05/12/2020 2:27 PM) Age at menarche 15 years. Contraceptive History Depo-provera, Intrauterine device, Oral contraceptives. Gravida 6 Irregular periods Length (months) of breastfeeding 12-24 Maternal age 48-20 Para 4  Other Problems 05/14/2020, MD; 05/12/2020 2:27 PM) Anxiety Disorder Back Pain Bladder Problems Depression Gastroesophageal Reflux Disease Hemorrhoids Migraine Headache     Review of Systems 19-38 MD; 05/12/2020 2:27 PM) General Not Present- Appetite Loss, Chills, Fatigue, Fever, Night Sweats, Weight Gain and Weight Loss. Skin Not Present- Change in Wart/Mole, Dryness, Hives, Jaundice, New Lesions, Non-Healing Wounds, Rash and Ulcer. HEENT Present- Wears glasses/contact lenses. Not Present- Earache, Hearing Loss, Hoarseness, Nose Bleed, Oral Ulcers, Ringing in the Ears, Seasonal Allergies, Sinus Pain, Sore Throat, Visual Disturbances and Yellow Eyes. Breast Not Present- Breast Mass, Breast Pain, Nipple Discharge and Skin Changes. Cardiovascular Not Present- Chest Pain,  Difficulty Breathing Lying Down, Leg Cramps, Palpitations, Rapid Heart Rate, Shortness of Breath and Swelling of Extremities. Gastrointestinal Present- Abdominal Pain, Constipation, Gets full quickly at meals, Hemorrhoids, Nausea and Rectal Pain. Not Present- Bloating, Bloody Stool, Change in Bowel Habits, Chronic diarrhea, Difficulty Swallowing, Excessive  gas, Indigestion and Vomiting. Female Genitourinary Present- Painful Urination and Pelvic Pain. Not Present- Frequency, Nocturia and Urgency. Musculoskeletal Present- Back Pain, Joint Pain and Joint Stiffness. Not Present- Muscle Pain, Muscle Weakness and Swelling of Extremities. Neurological Present- Fainting and Headaches. Not Present- Decreased Memory, Numbness, Seizures, Tingling, Tremor, Trouble walking and Weakness. Psychiatric Present- Anxiety, Bipolar, Depression and Fearful. Not Present- Change in Sleep Pattern and Frequent crying. Endocrine Present- Cold Intolerance. Not Present- Excessive Hunger, Hair Changes, Heat Intolerance, Hot flashes and New Diabetes. Hematology Present- Easy Bruising. Not Present- Blood Thinners, Excessive bleeding, Gland problems, HIV and Persistent Infections.  Vitals (Lisa Caldwell RMA; 05/12/2020 2:19 PM) 05/12/2020 2:18 PM Weight: 106 lb Height: 64in Body Surface Area: 1.49 m Body Mass Index: 18.19 kg/m  Temp.: 98.3F  Pulse: 121 (Regular)  P.OX: 98% (Room air) BP: 138/84(Sitting, Left Arm, Standard)    Physical Exam (Lempi Edwin MD; 05/12/2020 2:28 PM)  General Mental Status-Alert. General Appearance-Cooperative.  Abdomen Palpation/Percussion Palpation and Percussion of the abdomen reveal - Soft and Non Tender.  Rectal Note: anal fissure, posterior midline    Assessment & Plan (Brayon Bielefeld MD; 05/12/2020 2:26 PM)  ANAL FISSURE (K60.2) Impression: 34-year-old female who presents to the office with continued anal pain. She is status post chemical sphincterotomy in May of  this year for similar symptoms. She reports that her symptoms resolved for some time but then recurred. She is taking a fiber supplement and stool softeners. On exam today, she appears to have a recurrent anal fissure. We discussed performing a lateral internal sphincterotomy. We discussed a risk of incontinence of approximately 20%. She is okay with this risk and would like to proceed with surgery. 

## 2020-05-22 ENCOUNTER — Emergency Department (HOSPITAL_COMMUNITY)
Admission: EM | Admit: 2020-05-22 | Discharge: 2020-05-22 | Disposition: A | Discharge status: Home / Self Care | Payer: Medicaid Other | Attending: Emergency Medicine | Admitting: Emergency Medicine

## 2020-05-22 ENCOUNTER — Encounter (HOSPITAL_COMMUNITY): Payer: Self-pay | Admitting: Emergency Medicine

## 2020-05-22 ENCOUNTER — Other Ambulatory Visit: Payer: Self-pay

## 2020-05-22 DIAGNOSIS — Z5321 Procedure and treatment not carried out due to patient leaving prior to being seen by health care provider: Secondary | ICD-10-CM | POA: Insufficient documentation

## 2020-05-22 DIAGNOSIS — K649 Unspecified hemorrhoids: Secondary | ICD-10-CM | POA: Insufficient documentation

## 2020-05-22 NOTE — ED Triage Notes (Signed)
Pt states that she has to have surgery on her hemorrhoids 9/14. Reports medications arent working.

## 2020-05-24 ENCOUNTER — Other Ambulatory Visit: Payer: Self-pay

## 2020-05-24 ENCOUNTER — Encounter (HOSPITAL_BASED_OUTPATIENT_CLINIC_OR_DEPARTMENT_OTHER): Payer: Self-pay | Admitting: General Surgery

## 2020-05-24 ENCOUNTER — Other Ambulatory Visit (HOSPITAL_COMMUNITY)
Admission: RE | Admit: 2020-05-24 | Discharge: 2020-05-24 | Disposition: A | Discharge status: Home / Self Care | Payer: Medicaid Other | Source: Ambulatory Visit | Attending: General Surgery | Admitting: General Surgery

## 2020-05-24 DIAGNOSIS — Z20822 Contact with and (suspected) exposure to covid-19: Secondary | ICD-10-CM | POA: Insufficient documentation

## 2020-05-24 DIAGNOSIS — Z01812 Encounter for preprocedural laboratory examination: Secondary | ICD-10-CM | POA: Diagnosis present

## 2020-05-24 LAB — SARS CORONAVIRUS 2 (TAT 6-24 HRS): SARS Coronavirus 2: NEGATIVE

## 2020-05-24 NOTE — Progress Notes (Signed)
Spoke w/ via phone for pre-op interview--- PT Lab needs dos---- Urine preg/  Ask MDA if want UDS done (ordered placed)              Lab results------ no COVID test ------ 05-24-2020 @ 0830 Arrive at ------- 1100 NPO after MN NO Solid Food.  Clear liquids from MN until--- 1000 Medications to take morning of surgery ----- NONE Diabetic medication ----- n/a Patient Special Instructions ----- n/a Pre-Op special Istructions ----- n/a Patient verbalized understanding of instructions that were given at this phone interview. Patient denies shortness of breath, chest pain, fever, cough at this phone interview.

## 2020-05-25 ENCOUNTER — Ambulatory Visit (HOSPITAL_BASED_OUTPATIENT_CLINIC_OR_DEPARTMENT_OTHER): Payer: Medicaid Other | Admitting: Certified Registered Nurse Anesthetist

## 2020-05-25 ENCOUNTER — Ambulatory Visit (HOSPITAL_BASED_OUTPATIENT_CLINIC_OR_DEPARTMENT_OTHER)
Admission: RE | Admit: 2020-05-25 | Discharge: 2020-05-25 | Disposition: A | Discharge status: Home / Self Care | Payer: Medicaid Other | Attending: General Surgery | Admitting: General Surgery

## 2020-05-25 ENCOUNTER — Encounter (HOSPITAL_BASED_OUTPATIENT_CLINIC_OR_DEPARTMENT_OTHER)
Admission: RE | Disposition: A | Discharge status: Home / Self Care | Payer: Self-pay | Source: Home / Self Care | Attending: General Surgery

## 2020-05-25 ENCOUNTER — Encounter (HOSPITAL_BASED_OUTPATIENT_CLINIC_OR_DEPARTMENT_OTHER): Payer: Self-pay | Admitting: General Surgery

## 2020-05-25 DIAGNOSIS — K602 Anal fissure, unspecified: Secondary | ICD-10-CM | POA: Diagnosis present

## 2020-05-25 DIAGNOSIS — Z79891 Long term (current) use of opiate analgesic: Secondary | ICD-10-CM | POA: Diagnosis not present

## 2020-05-25 DIAGNOSIS — K648 Other hemorrhoids: Secondary | ICD-10-CM | POA: Insufficient documentation

## 2020-05-25 DIAGNOSIS — Z791 Long term (current) use of non-steroidal anti-inflammatories (NSAID): Secondary | ICD-10-CM | POA: Insufficient documentation

## 2020-05-25 DIAGNOSIS — Z79899 Other long term (current) drug therapy: Secondary | ICD-10-CM | POA: Diagnosis not present

## 2020-05-25 DIAGNOSIS — Z793 Long term (current) use of hormonal contraceptives: Secondary | ICD-10-CM | POA: Diagnosis not present

## 2020-05-25 DIAGNOSIS — F17210 Nicotine dependence, cigarettes, uncomplicated: Secondary | ICD-10-CM | POA: Insufficient documentation

## 2020-05-25 DIAGNOSIS — K601 Chronic anal fissure: Secondary | ICD-10-CM | POA: Diagnosis not present

## 2020-05-25 HISTORY — DX: Other psychoactive substance abuse, uncomplicated: F19.10

## 2020-05-25 HISTORY — DX: Personal history of other diseases of the digestive system: Z87.19

## 2020-05-25 HISTORY — PX: SPHINCTEROTOMY: SHX5279

## 2020-05-25 LAB — RAPID URINE DRUG SCREEN, HOSP PERFORMED
Amphetamines: NOT DETECTED
Barbiturates: NOT DETECTED
Benzodiazepines: NOT DETECTED
Cocaine: NOT DETECTED
Opiates: NOT DETECTED
Tetrahydrocannabinol: POSITIVE — AB

## 2020-05-25 LAB — POCT PREGNANCY, URINE: Preg Test, Ur: NEGATIVE

## 2020-05-25 SURGERY — SPHINCTEROTOMY, ANAL
Anesthesia: Monitor Anesthesia Care | Site: Rectum

## 2020-05-25 MED ORDER — ONDANSETRON HCL 4 MG/2ML IJ SOLN
INTRAMUSCULAR | Status: AC
  Filled 2020-05-25: qty 2

## 2020-05-25 MED ORDER — ACETAMINOPHEN 325 MG PO TABS: 650.0000 mg | ORAL_TABLET | ORAL | Status: DC | PRN

## 2020-05-25 MED ORDER — OXYCODONE HCL 5 MG PO TABS: 5.0000 mg | ORAL_TABLET | ORAL | Status: DC | PRN

## 2020-05-25 MED ORDER — ONDANSETRON HCL 4 MG/2ML IJ SOLN
INTRAMUSCULAR | Status: DC | PRN
  Administered 2020-05-25: 4 mg via INTRAVENOUS

## 2020-05-25 MED ORDER — BUPIVACAINE-EPINEPHRINE 0.5% -1:200000 IJ SOLN
INTRAMUSCULAR | Status: DC | PRN
  Administered 2020-05-25: 30 mL

## 2020-05-25 MED ORDER — MIDAZOLAM HCL 2 MG/2ML IJ SOLN
INTRAMUSCULAR | Status: AC
  Filled 2020-05-25: qty 2

## 2020-05-25 MED ORDER — LACTATED RINGERS IV SOLN: INTRAVENOUS | Status: DC

## 2020-05-25 MED ORDER — LIDOCAINE 2% (20 MG/ML) 5 ML SYRINGE
INTRAMUSCULAR | Status: AC
  Filled 2020-05-25: qty 5

## 2020-05-25 MED ORDER — LIDOCAINE 2% (20 MG/ML) 5 ML SYRINGE
INTRAMUSCULAR | Status: DC | PRN
  Administered 2020-05-25: 40 mg via INTRAVENOUS

## 2020-05-25 MED ORDER — PROPOFOL 500 MG/50ML IV EMUL
INTRAVENOUS | Status: AC
  Filled 2020-05-25: qty 50

## 2020-05-25 MED ORDER — FENTANYL CITRATE (PF) 100 MCG/2ML IJ SOLN
INTRAMUSCULAR | Status: DC | PRN
Start: 2020-05-25 — End: 2020-05-25
  Administered 2020-05-25 (×2): 50 ug via INTRAVENOUS

## 2020-05-25 MED ORDER — KETAMINE HCL 10 MG/ML IJ SOLN
INTRAMUSCULAR | Status: AC
  Filled 2020-05-25: qty 1

## 2020-05-25 MED ORDER — HYDROCODONE-ACETAMINOPHEN 5-325 MG PO TABS: 1.0000 | ORAL_TABLET | Freq: Four times a day (QID) | ORAL | 0 refills | Status: DC | PRN

## 2020-05-25 MED ORDER — LIDOCAINE 5 % EX OINT
TOPICAL_OINTMENT | CUTANEOUS | Status: DC | PRN
  Administered 2020-05-25: 1

## 2020-05-25 MED ORDER — PROPOFOL 500 MG/50ML IV EMUL
INTRAVENOUS | Status: DC | PRN
  Administered 2020-05-25: 150 ug/kg/min via INTRAVENOUS

## 2020-05-25 MED ORDER — ACETAMINOPHEN 325 MG RE SUPP: 650.0000 mg | RECTAL | Status: DC | PRN

## 2020-05-25 MED ORDER — SODIUM CHLORIDE 0.9 % IV SOLN: 250.0000 mL | INTRAVENOUS | Status: DC | PRN

## 2020-05-25 MED ORDER — KETAMINE HCL 10 MG/ML IJ SOLN
INTRAMUSCULAR | Status: DC | PRN
  Administered 2020-05-25: 20 mg via INTRAVENOUS
  Administered 2020-05-25: 10 mg via INTRAVENOUS

## 2020-05-25 MED ORDER — SODIUM CHLORIDE 0.9% FLUSH: 3.0000 mL | INTRAVENOUS | Status: DC | PRN

## 2020-05-25 MED ORDER — MIDAZOLAM HCL 5 MG/5ML IJ SOLN
INTRAMUSCULAR | Status: DC | PRN
  Administered 2020-05-25: 2 mg via INTRAVENOUS

## 2020-05-25 MED ORDER — FENTANYL CITRATE (PF) 100 MCG/2ML IJ SOLN: 25.0000 ug | INTRAMUSCULAR | Status: DC | PRN

## 2020-05-25 MED ORDER — SODIUM CHLORIDE 0.9% FLUSH: 3.0000 mL | Freq: Two times a day (BID) | INTRAVENOUS | Status: DC

## 2020-05-25 MED ORDER — FENTANYL CITRATE (PF) 100 MCG/2ML IJ SOLN
INTRAMUSCULAR | Status: AC
  Filled 2020-05-25: qty 2

## 2020-05-25 MED ORDER — PROMETHAZINE HCL 25 MG/ML IJ SOLN: 6.2500 mg | INTRAMUSCULAR | Status: DC | PRN

## 2020-05-25 MED ORDER — PROPOFOL 10 MG/ML IV BOLUS
INTRAVENOUS | Status: DC | PRN
  Administered 2020-05-25 (×2): 20 mg via INTRAVENOUS
  Administered 2020-05-25: 40 mg via INTRAVENOUS

## 2020-05-25 SURGICAL SUPPLY — 43 items
APL SKNCLS STERI-STRIP NONHPOA (GAUZE/BANDAGES/DRESSINGS) ×1
BENZOIN TINCTURE PRP APPL 2/3 (GAUZE/BANDAGES/DRESSINGS) ×3 IMPLANT
BLADE HEX COATED 2.75 (ELECTRODE) ×3 IMPLANT
BLADE SURG 15 STRL LF DISP TIS (BLADE) ×1 IMPLANT
BLADE SURG 15 STRL SS (BLADE) ×3
BRIEF STRETCH FOR OB PAD LRG (UNDERPADS AND DIAPERS) ×3 IMPLANT
COVER BACK TABLE 60X90IN (DRAPES) ×3 IMPLANT
COVER MAYO STAND STRL (DRAPES) ×3 IMPLANT
COVER WAND RF STERILE (DRAPES) ×3 IMPLANT
DRAPE LAPAROTOMY 100X72 PEDS (DRAPES) ×3 IMPLANT
DRAPE UTILITY XL STRL (DRAPES) ×3 IMPLANT
DRSG PAD ABDOMINAL 8X10 ST (GAUZE/BANDAGES/DRESSINGS) ×3 IMPLANT
ELECT REM PT RETURN 9FT ADLT (ELECTROSURGICAL) ×3
ELECTRODE REM PT RTRN 9FT ADLT (ELECTROSURGICAL) ×1 IMPLANT
GAUZE SPONGE 4X4 12PLY STRL (GAUZE/BANDAGES/DRESSINGS) ×3 IMPLANT
GLOVE BIO SURGEON STRL SZ 6.5 (GLOVE) ×2 IMPLANT
GLOVE BIO SURGEONS STRL SZ 6.5 (GLOVE) ×1
GLOVE BIOGEL PI IND STRL 6.5 (GLOVE) ×1 IMPLANT
GLOVE BIOGEL PI IND STRL 7.0 (GLOVE) ×1 IMPLANT
GLOVE BIOGEL PI IND STRL 7.5 (GLOVE) ×1 IMPLANT
GLOVE BIOGEL PI INDICATOR 6.5 (GLOVE) ×2
GLOVE BIOGEL PI INDICATOR 7.0 (GLOVE) ×2
GLOVE BIOGEL PI INDICATOR 7.5 (GLOVE) ×2
GLOVE ECLIPSE 6.5 STRL STRAW (GLOVE) ×3 IMPLANT
GOWN STRL REUS W/TWL XL LVL3 (GOWN DISPOSABLE) ×3 IMPLANT
KIT SIGMOIDOSCOPE (SET/KITS/TRAYS/PACK) IMPLANT
KIT TURNOVER CYSTO (KITS) ×3 IMPLANT
NDL SAFETY ECLIPSE 18X1.5 (NEEDLE) IMPLANT
NEEDLE HYPO 18GX1.5 SHARP (NEEDLE)
NEEDLE HYPO 22GX1.5 SAFETY (NEEDLE) ×3 IMPLANT
NS IRRIG 500ML POUR BTL (IV SOLUTION) ×3 IMPLANT
PACK BASIN DAY SURGERY FS (CUSTOM PROCEDURE TRAY) ×3 IMPLANT
PAD ARMBOARD 7.5X6 YLW CONV (MISCELLANEOUS) IMPLANT
SUT CHROMIC 2 0 SH (SUTURE) IMPLANT
SUT CHROMIC 3 0 SH 27 (SUTURE) IMPLANT
SYR CONTROL 10ML LL (SYRINGE) ×3 IMPLANT
TOWEL OR 17X26 10 PK STRL BLUE (TOWEL DISPOSABLE) ×3 IMPLANT
TRAP FLUID SMOKE EVACUATOR (MISCELLANEOUS) ×3 IMPLANT
TUBE CONNECTING 12'X1/4 (SUCTIONS) ×1
TUBE CONNECTING 12X1/4 (SUCTIONS) ×2 IMPLANT
UNDERPAD 30X36 HEAVY ABSORB (UNDERPADS AND DIAPERS) ×3 IMPLANT
WATER STERILE IRR 500ML POUR (IV SOLUTION) ×3 IMPLANT
YANKAUER SUCT BULB TIP NO VENT (SUCTIONS) ×3 IMPLANT

## 2020-05-25 NOTE — Anesthesia Preprocedure Evaluation (Signed)
Anesthesia Evaluation  Patient identified by MRN, date of birth, ID band Patient awake    Reviewed: Allergy & Precautions, NPO status , Patient's Chart, lab work & pertinent test results  Airway Mallampati: II  TM Distance: >3 FB Neck ROM: Full    Dental no notable dental hx.    Pulmonary Current Smoker,    Pulmonary exam normal breath sounds clear to auscultation       Cardiovascular negative cardio ROS Normal cardiovascular exam Rhythm:Regular Rate:Normal     Neuro/Psych Bipolar Disorder negative neurological ROS     GI/Hepatic negative GI ROS, (+)     substance abuse  cocaine use,   Endo/Other  negative endocrine ROS  Renal/GU negative Renal ROS  negative genitourinary   Musculoskeletal negative musculoskeletal ROS (+)   Abdominal   Peds negative pediatric ROS (+)  Hematology negative hematology ROS (+)   Anesthesia Other Findings   Reproductive/Obstetrics negative OB ROS                             Anesthesia Physical Anesthesia Plan  ASA: II  Anesthesia Plan: MAC   Post-op Pain Management:    Induction: Intravenous  PONV Risk Score and Plan: 2 and Ondansetron, Dexamethasone and Treatment may vary due to age or medical condition  Airway Management Planned: Simple Face Mask  Additional Equipment:   Intra-op Plan:   Post-operative Plan:   Informed Consent: I have reviewed the patients History and Physical, chart, labs and discussed the procedure including the risks, benefits and alternatives for the proposed anesthesia with the patient or authorized representative who has indicated his/her understanding and acceptance.     Dental advisory given  Plan Discussed with: CRNA and Surgeon  Anesthesia Plan Comments:         Anesthesia Quick Evaluation

## 2020-05-25 NOTE — Anesthesia Postprocedure Evaluation (Signed)
Anesthesia Post Note  Patient: Mandy Wilson  Procedure(s) Performed: LATERAL INTERNAL SPHINCTEROTOMY (N/A Rectum)     Patient location during evaluation: PACU Anesthesia Type: MAC Level of consciousness: awake and alert Pain management: pain level controlled Vital Signs Assessment: post-procedure vital signs reviewed and stable Respiratory status: spontaneous breathing, nonlabored ventilation, respiratory function stable and patient connected to nasal cannula oxygen Cardiovascular status: stable and blood pressure returned to baseline Postop Assessment: no apparent nausea or vomiting Anesthetic complications: no   No complications documented.  Last Vitals:  Vitals:   05/25/20 1242 05/25/20 1315  BP: (!) 124/94 118/67  Pulse:  65  Resp:  16  Temp:    SpO2:  100%    Last Pain:  Vitals:   05/25/20 1315  TempSrc:   PainSc: 0-No pain                 Shar Paez S

## 2020-05-25 NOTE — Transfer of Care (Signed)
Immediate Anesthesia Transfer of Care Note  Patient: Mandy Wilson  Procedure(s) Performed: LATERAL INTERNAL SPHINCTEROTOMY (N/A Rectum)  Patient Location: PACU  Anesthesia Type:MAC  Level of Consciousness: drowsy and patient cooperative  Airway & Oxygen Therapy: Patient Spontanous Breathing and Patient connected to face mask oxygen  Post-op Assessment: Report given to RN and Post -op Vital signs reviewed and stable  Post vital signs: Reviewed and stable  Last Vitals:  Vitals Value Taken Time  BP    Temp    Pulse 77 05/25/20 1204  Resp 15 05/25/20 1204  SpO2 100 % 05/25/20 1204  Vitals shown include unvalidated device data.  Last Pain:  Vitals:   05/25/20 1124  TempSrc: Oral  PainSc: 0-No pain      Patients Stated Pain Goal: 4 (79/03/83 3383)  Complications: No complications documented.

## 2020-05-25 NOTE — Discharge Instructions (Addendum)
ANORECTAL SURGERY: POST OP INSTRUCTIONS 1. Take your usually prescribed home medications unless otherwise directed. 2. DIET: During the first few hours after surgery sip on some liquids until you are able to urinate.  It is normal to not urinate for several hours after this surgery.  If you feel uncomfortable, please contact the office for instructions.  After you are able to urinate,you may eat, if you feel like it.  Follow a light bland diet the first 24 hours after arrival home, such as soup, liquids, crackers, etc.  Be sure to include lots of fluids daily (6-8 glasses).  Avoid fast food or heavy meals, as your are more likely to get nauseated.  Eat a low fat diet the next few days after surgery.  Limit caffeine intake to 1-2 servings a day. 3. PAIN CONTROL: a. Pain is best controlled by a usual combination of several different methods TOGETHER: i. Muscle relaxation: Soak in a warm bath (or Sitz bath) three times a day and after bowel movements.  Continue to do this until all pain is resolved. ii. Over the counter pain medication iii. Prescription pain medication b. Most patients will experience some swelling and discomfort in the anus/rectal area and incisions.  Heat such as warm towels, sitz baths, warm baths, etc to help relax tight/sore spots and speed recovery.  Some people prefer to use ice, especially in the first couple days after surgery, as it may decrease the pain and swelling, or alternate between ice & heat.  Experiment to what works for you.  Swelling and bruising can take several weeks to resolve.  Pain can take even longer to completely resolve. c. It is helpful to take an over-the-counter pain medication regularly for the first few weeks.  Choose one of the following that works best for you: i. Naproxen (Aleve, etc)  Two 220mg tabs twice a day ii. Ibuprofen (Advil, etc) Three 200mg tabs four times a day (every meal & bedtime) d. A  prescription for pain medication (such as percocet,  oxycodone, hydrocodone, etc) should be given to you upon discharge.  Take your pain medication as prescribed.  i. If you are having problems/concerns with the prescription medicine (does not control pain, nausea, vomiting, rash, itching, etc), please call us [phone] to see if we need to switch you to a different pain medicine that will work better for you and/or control your side effect better. ii. If you need a refill on your pain medication, please contact your pharmacy.  They will contact our office to request authorization. Prescriptions will not be filled after 5 pm or on week-ends. 4. KEEP YOUR BOWELS REGULAR and AVOID CONSTIPATION a. The goal is one to two soft bowel movements a day.  You should at least have a bowel movement every other day. b. Avoid getting constipated.  Between the surgery and the pain medications, it is common to experience some constipation. This can be very painful after rectal surgery.  Increasing fluid intake and taking a fiber supplement (such as Metamucil, Citrucel, FiberCon, etc) 1-2 times a day regularly will usually help prevent this problem from occurring.  A stool softener like colace is also recommended.  This can be purchased over the counter at your pharmacy.  You can take it up to 3 times a day.  If you do not have a bowel movement after 24 hrs since your surgery, take one does of milk of magnesia.  If you still haven't had a bowel movement 8-12 hours after   that dose, take another dose.  If you don't have a bowel movement 48 hrs after surgery, purchase a Fleets enema from the drug store and administer gently per package instructions.  If you still are having trouble with your bowel movements after that, please call the office for further instructions. c. If you develop diarrhea or have many loose bowel movements, simplify your diet to bland foods & liquids for a few days.  Stop any stool softeners and decrease your fiber supplement.  Switching to mild  anti-diarrheal medications (Kayopectate, Pepto Bismol) can help.  If this worsens or does not improve, please call us.  5. Wound Care a. Remove your bandages before your first bowel movement or 8 hours after surgery.     b. Remove any wound packing material at this tim,e as well.  You do not need to repack the wound unless instructed otherwise.  Wear an absorbent pad or soft cotton gauze in your underwear to catch any drainage and help keep the area clean. You should change this every 2-3 hours while awake. c. Keep the area clean and dry.  Bathe / shower every day, especially after bowel movements.  Keep the area clean by showering / bathing over the incision / wound.   It is okay to soak an open wound to help wash it.  Wet wipes or showers / gentle washing after bowel movements is often less traumatic than regular toilet paper. d. You may have some styrofoam-like soft packing in the rectum which will come out with the first bowel movement.  e. You will often notice bleeding with bowel movements.  This should slow down by the end of the first week of surgery f. Expect some drainage.  This should slow down, too, by the end of the first week of surgery.  Wear an absorbent pad or soft cotton gauze in your underwear until the drainage stops. g. Do Not sit on a rubber or pillow ring.  This can make you symptoms worse.  You may sit on a soft pillow if needed.  6. ACTIVITIES as tolerated:   a. You may resume regular (light) daily activities beginning the next day--such as daily self-care, walking, climbing stairs--gradually increasing activities as tolerated.  If you can walk 30 minutes without difficulty, it is safe to try more intense activity such as jogging, treadmill, bicycling, low-impact aerobics, swimming, etc. b. Save the most intensive and strenuous activity for last such as sit-ups, heavy lifting, contact sports, etc  Refrain from any heavy lifting or straining until you are off narcotics for pain  control.   c. You may drive when you are no longer taking prescription pain medication, you can comfortably sit for long periods of time, and you can safely maneuver your car and apply brakes. d. You may have sexual intercourse when it is comfortable.  7. FOLLOW UP in our office a. Please call [facility] at [phone] to set up an appointment to see your surgeon in the office for a follow-up appointment approximately 3-4 weeks after your surgery. b. Make sure that you call for this appointment the day you arrive home to insure a convenient appointment time. 10. IF YOU HAVE DISABILITY OR FAMILY LEAVE FORMS, BRING THEM TO THE OFFICE FOR PROCESSING.  DO NOT GIVE THEM TO YOUR DOCTOR.     WHEN TO CALL US [phone]: 1. Poor pain control 2. Reactions / problems with new medications (rash/itching, nausea, etc)  3. Fever over 101.5 F (38.5 C) 4.   Inability to urinate 5. Nausea and/or vomiting 6. Worsening swelling or bruising 7. Continued bleeding from incision. 8. Increased pain, redness, or drainage from the incision  The clinic staff is available to answer your questions during regular business hours (8:30am-5pm).  Please don't hesitate to call and ask to speak to one of our nurses for clinical concerns.   A surgeon from [facility] is always on call at the hospitals   If you have a medical emergency, go to the nearest emergency room or call 911.    [facility] [address], [address], [place], [place]  [address] ? MAIN: [phone] ? TOLL FREE: [phone] ? FAX [phone] [email or web address]     Post Anesthesia Home Care Instructions  Activity: Get plenty of rest for the remainder of the day. A responsible individual must stay with you for 24 hours following the procedure.  For the next 24 hours, DO NOT: -Drive a car -Operate machinery -Drink alcoholic beverages -Take any medication unless instructed by your  physician -Make any legal decisions or sign important papers.  Meals: Start with liquid foods such as gelatin or soup. Progress to regular foods as tolerated. Avoid greasy, spicy, heavy foods. If nausea and/or vomiting occur, drink only clear liquids until the nausea and/or vomiting subsides. Call your physician if vomiting continues.  Special Instructions/Symptoms: Your throat may feel dry or sore from the anesthesia or the breathing tube placed in your throat during surgery. If this causes discomfort, gargle with warm salt water. The discomfort should disappear within 24 hours.  If you had a scopolamine patch placed behind your ear for the management of post- operative nausea and/or vomiting:  1. The medication in the patch is effective for 72 hours, after which it should be removed.  Wrap patch in a tissue and discard in the trash. Wash hands thoroughly with soap and water. 2. You may remove the patch earlier than 72 hours if you experience unpleasant side effects which may include dry mouth, dizziness or visual disturbances. 3. Avoid touching the patch. Wash your hands with soap and water after contact with the patch.        

## 2020-05-25 NOTE — Interval H&P Note (Signed)
History and Physical Interval Note:  05/25/2020 11:11 AM  Mandy Wilson  has presented today for surgery, with the diagnosis of RECURRENT ANAL FISSURE.  The various methods of treatment have been discussed with the patient and family. After consideration of risks, benefits and other options for treatment, the patient has consented to  Procedure(s): LATERAL INTERNAL SPHINCTEROTOMY (N/A) as a surgical intervention.  The patient's history has been reviewed, patient examined, no change in status, stable for surgery.  I have reviewed the patient's chart and labs.  Questions were answered to the patient's satisfaction.     Vanita Panda, MD  Colorectal and General Surgery [facility]

## 2020-05-25 NOTE — Op Note (Signed)
05/25/2020  11:56 AM  PATIENT:  Emily Filbert  34 y.o. female  Patient Care Team: Patient, No Pcp Per as PCP - General (General Practice)  PRE-OPERATIVE DIAGNOSIS:  RECURRENT ANAL FISSURE  POST-OPERATIVE DIAGNOSIS:  RECURRENT ANAL FISSURE  PROCEDURE:   LATERAL INTERNAL SPHINCTEROTOMY    Surgeon(s): Leighton Ruff, MD  ASSISTANT: none   ANESTHESIA:   local and MAC  SPECIMEN:  No Specimen  DISPOSITION OF SPECIMEN:  N/A  COUNTS:  YES  PLAN OF CARE: Discharge to home after PACU  PATIENT DISPOSITION:  PACU - hemodynamically stable.  INDICATION: 34 y.o. 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  MAC anesthesia was induced without difficulty. The patient was then positioned in prone jackknife position with buttocks gently taped apart.  The patient was then prepped and draped in usual sterile fashion.  SCDs were noted to be in place prior to the initiation of anesthesia. A surgical timeout was performed indicating the correct patient, procedure, positioning and need for preoperative antibiotics.  A rectal block was performed using Marcaine with epinephrine.    I began with a digital rectal exam.  I dilated the anal canal gently to 2 fingerbreadths.  I then placed a Hill-Ferguson anoscope into the anal canal and evaluated this completely.   There was no internal hemorrhoid disease.  There was a small skin tag associated with the anal fissure.  I decided to excise this to allow for better healing of the fissure.  This was done using Metzenbaum scissors and closed using 2 interrupted 3-0 chromic sutures.  I then turned my attention to the lateral anal canal.  I made an incision over the intersphincteric groove using a 15  blade scalpel in the left lateral perianal region.  Dissection was carried down to the level of the sphincter complex.  I dissected out the internal sphincter.  I then divided the internal sphincter using Metzenbaum scissors to the level of the dentate line.  This relieved the sphincter hypertension.  Hemostasis was good.  The incision was closed using interrupted 3-0 chromic suture.  All counts were correct per operating room staff.  A dressing was applied.  The patient was then awakened from anesthesia and sent to the postanesthesia care unit in stable condition.

## 2020-05-26 ENCOUNTER — Encounter (HOSPITAL_BASED_OUTPATIENT_CLINIC_OR_DEPARTMENT_OTHER): Payer: Self-pay | Admitting: General Surgery

## 2020-06-07 ENCOUNTER — Other Ambulatory Visit (HOSPITAL_COMMUNITY): Payer: Medicaid Other

## 2021-05-24 ENCOUNTER — Encounter: Payer: Medicaid Other | Admitting: Obstetrics and Gynecology

## 2021-12-14 ENCOUNTER — Telehealth: Payer: Self-pay | Admitting: Emergency Medicine

## 2021-12-14 NOTE — Telephone Encounter (Signed)
We do not change IUD. ?

## 2021-12-14 NOTE — Telephone Encounter (Signed)
Copied from CRM [id number]. Topic: Appointment Scheduling - Scheduling Inquiry for Clinic >> Dec 12, 2021  9:58 AM Randol Kern wrote: Reason for CRM: Pt needs to have her IUD replaced, please advise. She has upcoming appts  Best contact: [phone]

## 2022-03-16 ENCOUNTER — Ambulatory Visit: Payer: Medicaid Other | Admitting: Nurse Practitioner

## 2022-03-30 ENCOUNTER — Encounter: Payer: Medicaid Other | Admitting: Nurse Practitioner

## 2022-04-16 ENCOUNTER — Ambulatory Visit: Payer: Medicaid Other | Admitting: Nurse Practitioner

## 2022-05-17 IMAGING — DX DG LUMBAR SPINE COMPLETE 4+V
5 series · 5 of 5 positions shown · non-contrast
Comparison: None.

CLINICAL DATA: Pain following fall

EXAM:
LUMBAR SPINE - COMPLETE 4+ VIEW

[t lumbar spine ap]
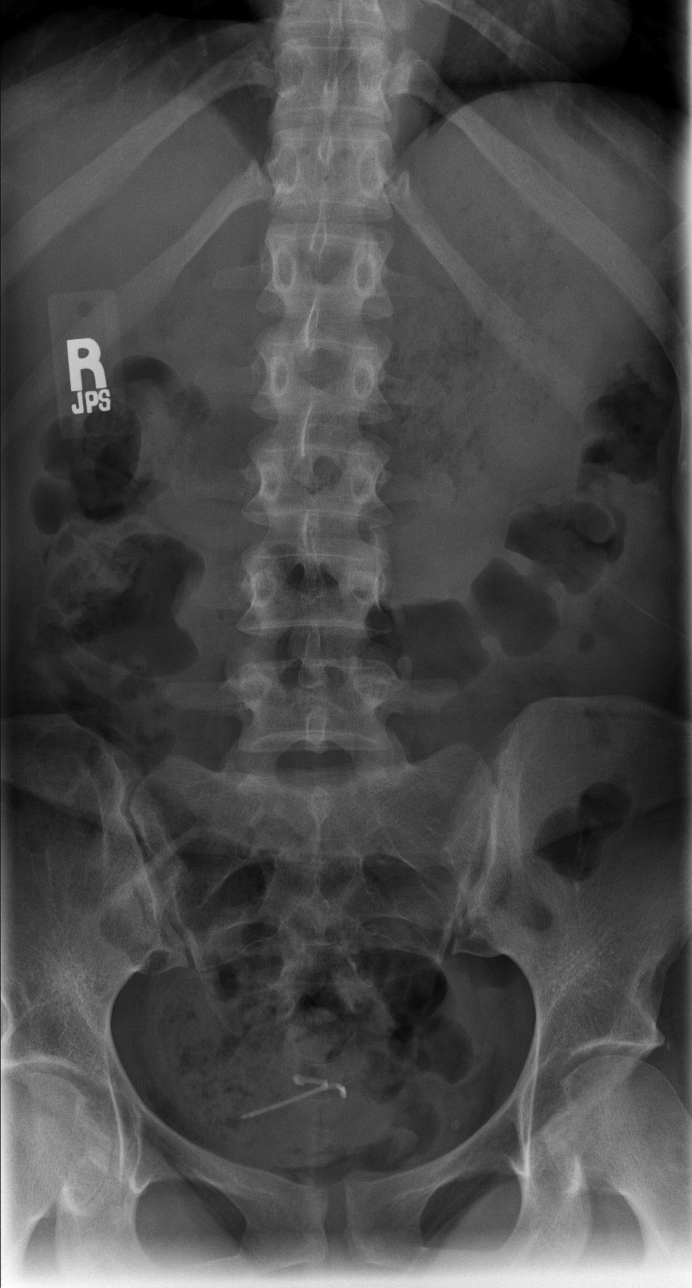

[t lumbar spine obl (1 of 2)]
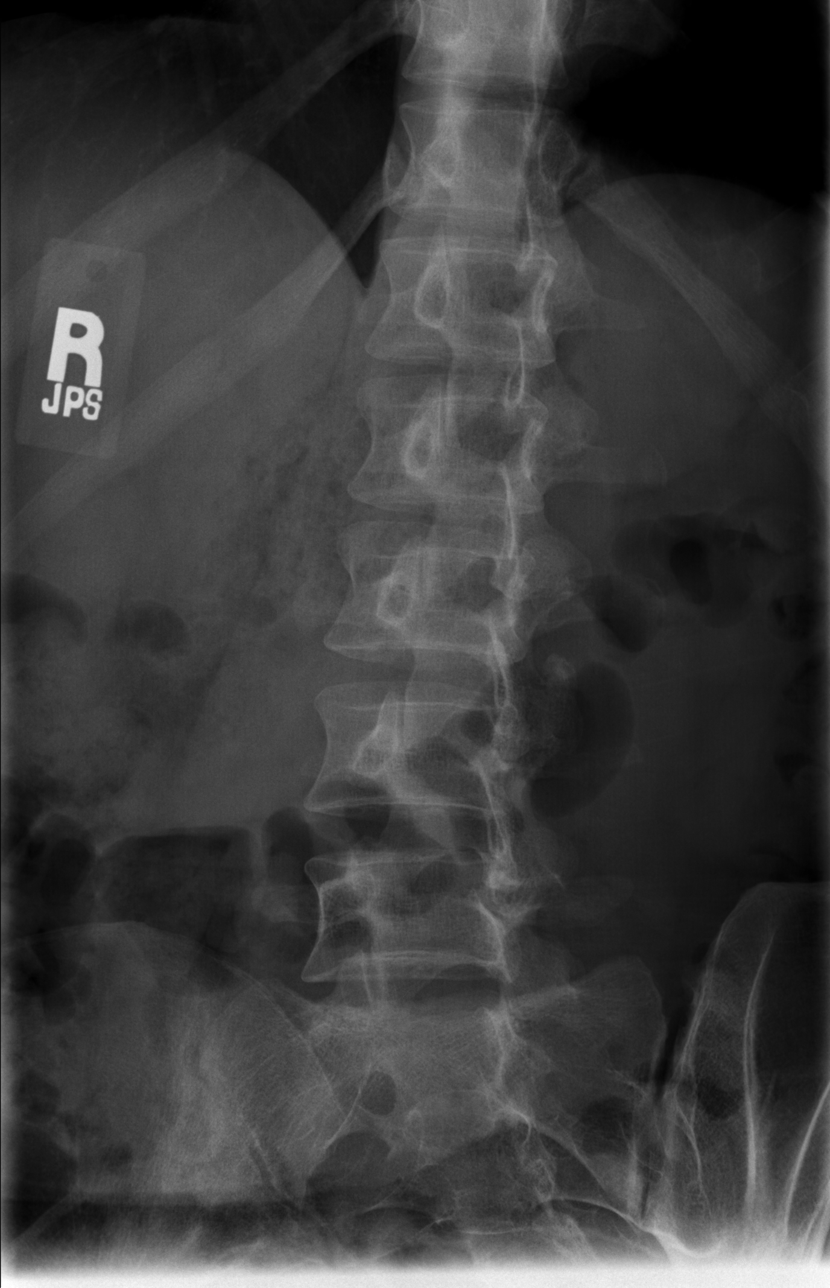

[t lumbar spine obl (2 of 2)]
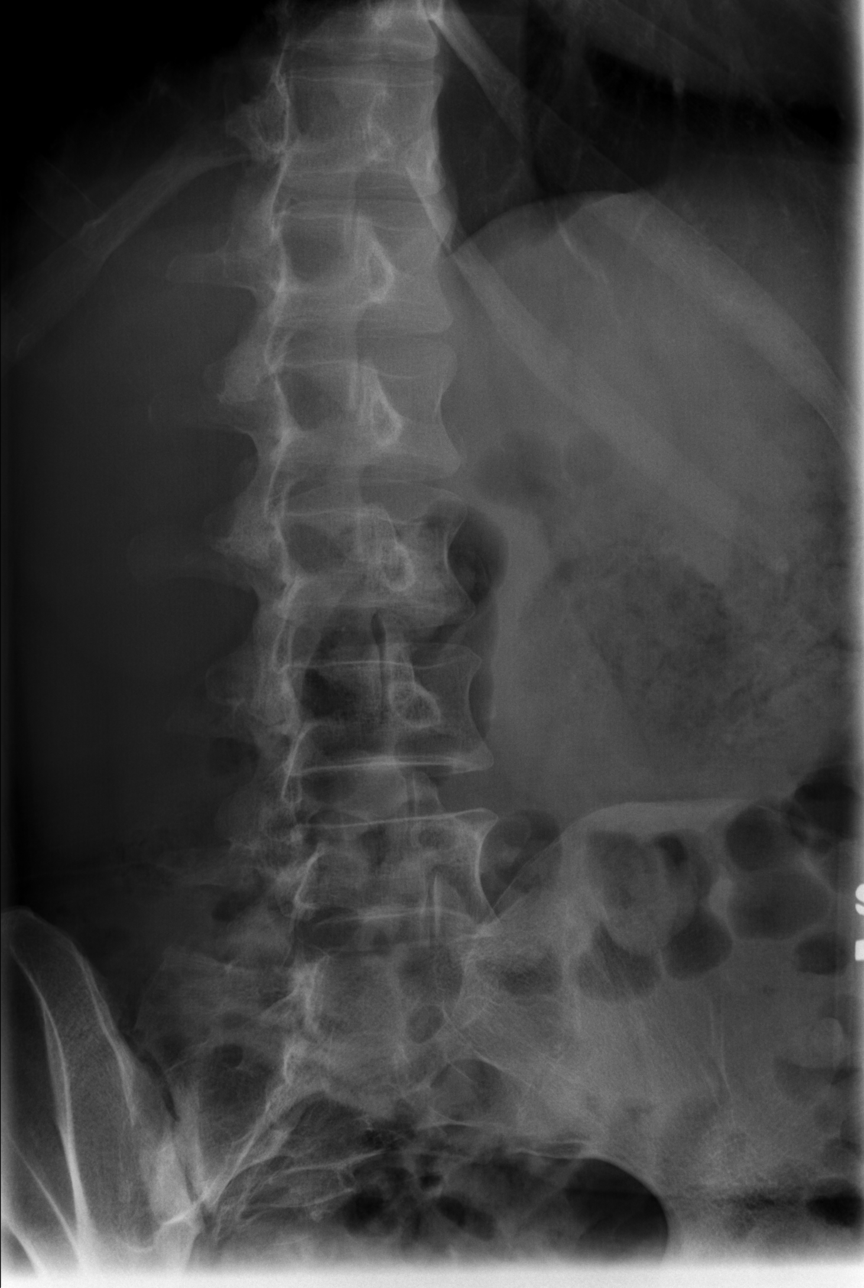

[t lumbar spine lat]
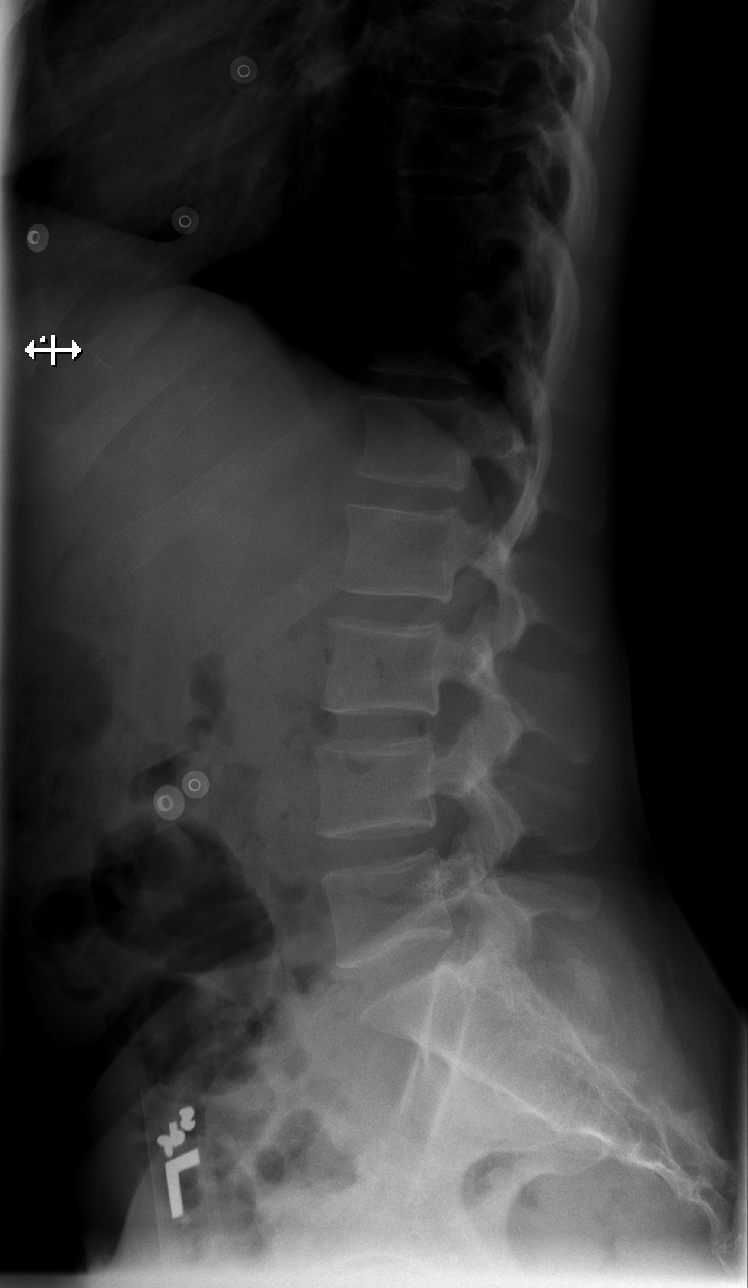

[t lumbar l-5 s-1 spot]
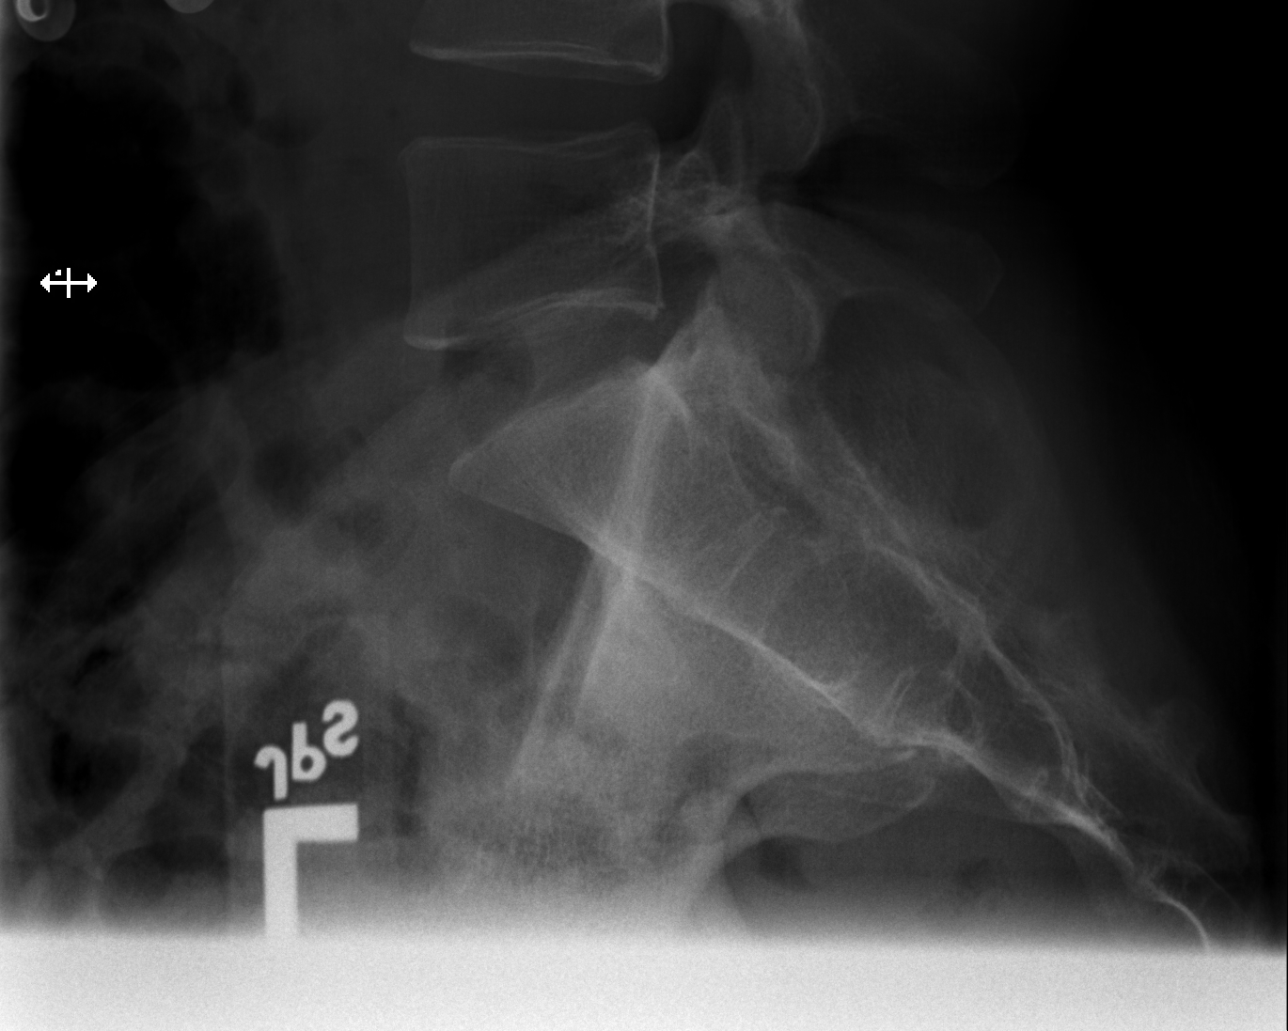

[5 of 5 positions shown; findings below may reference images not displayed]

FINDINGS: Frontal, lateral, spot lumbosacral lateral, and bilateral oblique
views were obtained. There are 5 non-rib-bearing lumbar type
vertebral bodies. There is no fracture or spondylolisthesis. Disc
spaces appear normal. There is no appreciable facet arthropathy.
There is an intrauterine device in the mid-pelvis.
IMPRESSION: No fracture or spondylolisthesis.  No evident arthropathy.

Intrauterine device in mid pelvis.

## 2022-07-17 ENCOUNTER — Encounter (HOSPITAL_COMMUNITY): Payer: Self-pay | Admitting: Emergency Medicine

## 2022-07-17 ENCOUNTER — Emergency Department (HOSPITAL_COMMUNITY)
Admission: EM | Admit: 2022-07-17 | Discharge: 2022-07-18 | Disposition: A | Discharge status: Home / Self Care | Payer: Medicaid Other | Attending: Emergency Medicine | Admitting: Emergency Medicine

## 2022-07-17 ENCOUNTER — Emergency Department (HOSPITAL_COMMUNITY): Payer: Medicaid Other

## 2022-07-17 DIAGNOSIS — Y92410 Unspecified street and highway as the place of occurrence of the external cause: Secondary | ICD-10-CM | POA: Diagnosis not present

## 2022-07-17 DIAGNOSIS — M545 Low back pain, unspecified: Secondary | ICD-10-CM | POA: Insufficient documentation

## 2022-07-17 DIAGNOSIS — M546 Pain in thoracic spine: Secondary | ICD-10-CM | POA: Diagnosis not present

## 2022-07-17 DIAGNOSIS — R220 Localized swelling, mass and lump, head: Secondary | ICD-10-CM | POA: Diagnosis not present

## 2022-07-17 DIAGNOSIS — S022XXA Fracture of nasal bones, initial encounter for closed fracture: Secondary | ICD-10-CM | POA: Insufficient documentation

## 2022-07-17 DIAGNOSIS — S0992XA Unspecified injury of nose, initial encounter: Secondary | ICD-10-CM | POA: Diagnosis present

## 2022-07-17 NOTE — ED Notes (Addendum)
Pt also complains of left shoulder pain and nose pain. Pt has small hematoma to left forehead. Pt did not lose consciousness. Denies blurry vision.
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
## 2022-07-18 ENCOUNTER — Other Ambulatory Visit: Payer: Self-pay

## 2022-07-18 ENCOUNTER — Encounter (HOSPITAL_COMMUNITY): Payer: Self-pay

## 2022-07-18 MED ORDER — LIDOCAINE 5 % EX PTCH
2.0000 | MEDICATED_PATCH | CUTANEOUS | Status: DC
  Administered 2022-07-18: 2 via TRANSDERMAL
  Filled 2022-07-18: qty 2

## 2022-07-18 MED ORDER — LIDOCAINE 5 % EX PTCH: 1.0000 | MEDICATED_PATCH | CUTANEOUS | 0 refills | Status: DC

## 2022-07-18 MED ORDER — ACETAMINOPHEN 325 MG PO TABS
650.0000 mg | ORAL_TABLET | Freq: Once | ORAL | Status: AC
  Administered 2022-07-18: 650 mg via ORAL
  Filled 2022-07-18: qty 2

## 2022-07-18 MED ORDER — IBUPROFEN 400 MG PO TABS: 400.0000 mg | ORAL_TABLET | Freq: Four times a day (QID) | ORAL | 0 refills | Status: DC | PRN

## 2022-07-18 MED ORDER — IBUPROFEN 400 MG PO TABS
600.0000 mg | ORAL_TABLET | Freq: Once | ORAL | Status: AC
  Administered 2022-07-18: 600 mg via ORAL
  Filled 2022-07-18: qty 1
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
## 2022-07-18 NOTE — ED Notes (Signed)
Provider at bedside at this time
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
## 2022-08-10 ENCOUNTER — Ambulatory Visit: Payer: Medicaid Other | Admitting: Nurse Practitioner

## 2022-10-18 DIAGNOSIS — Z975 Presence of (intrauterine) contraceptive device: Secondary | ICD-10-CM | POA: Insufficient documentation

## 2023-07-16 ENCOUNTER — Ambulatory Visit: Payer: Self-pay

## 2023-07-16 NOTE — Telephone Encounter (Signed)
      Chief Complaint: Lower abdominal pain, radiates to back and legs. Occasional nausea. Pain at old C-Section scar. Going to [facility], but asking for sooner New Patient appointment. Symptoms: Above Frequency: Started "awhile back, but getting worse." Pertinent Negatives: Patient denies fever Disposition: [] ED /[] Urgent Care (no appt availability in office) / [] Appointment(In office/virtual)/ []  [facility]/ [] Home Care/ [] Refused Recommended Disposition /[x] [facility]/ []  Follow-up with PCP Additional Notes: Pt. Agrees with Mobile [facility]. Asking for sooner New Patient appointment.  Reason for Disposition  [1] MILD-MODERATE pain AND [2] constant AND [3] present > 2 hours  Answer Assessment - Initial Assessment Questions 1. LOCATION: "Where does it hurt?"      Lower at old C-Section incision 2. RADIATION: "Does the pain shoot anywhere else?" (e.g., chest, back)     Back and down legs 3. ONSET: "When did the pain begin?" (e.g., minutes, hours or days ago)      Last week 4. SUDDEN: "Gradual or sudden onset?"     Gradual 5. PATTERN "Does the pain come and go, or is it constant?"    - If it comes and goes: "How long does it last?" "Do you have pain now?"     (Note: Comes and goes means the pain is intermittent. It goes away completely between bouts.)    - If constant: "Is it getting better, staying the same, or getting worse?"      (Note: Constant means the pain never goes away completely; most serious pain is constant and gets worse.)      Comes and goes 6. SEVERITY: "How bad is the pain?"  (e.g., Scale 1-10; mild, moderate, or severe)    - MILD (1-3): Doesn't interfere with normal activities, abdomen soft and not tender to touch.     - MODERATE (4-7): Interferes with normal activities or awakens from sleep, abdomen tender to touch.     - SEVERE (8-10): Excruciating pain, doubled over, unable to do any normal activities.       Burning pain - 6 7. RECURRENT  SYMPTOM: "Have you ever had this type of stomach pain before?" If Yes, ask: "When was the last time?" and "What happened that time?"      Yes 8. CAUSE: "What do you think is causing the stomach pain?"     Unsure 9. RELIEVING/AGGRAVATING FACTORS: "What makes it better or worse?" (e.g., antacids, bending or twisting motion, bowel movement)     No 10. OTHER SYMPTOMS: "Do you have any other symptoms?" (e.g., back pain, diarrhea, fever, urination pain, vomiting)       Nausea 11. PREGNANCY: "Is there any chance you are pregnant?" "When was your last menstrual period?"       No  Protocols used: Abdominal Pain - [facility]

## 2023-07-16 NOTE — Telephone Encounter (Signed)
Noted  

## 2023-08-26 ENCOUNTER — Ambulatory Visit: Payer: Medicaid Other | Admitting: Family Medicine

## 2023-10-30 ENCOUNTER — Telehealth (INDEPENDENT_AMBULATORY_CARE_PROVIDER_SITE_OTHER): Payer: Self-pay | Admitting: Primary Care
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
## 2023-10-31 ENCOUNTER — Telehealth (INDEPENDENT_AMBULATORY_CARE_PROVIDER_SITE_OTHER): Payer: Self-pay

## 2023-10-31 ENCOUNTER — Ambulatory Visit (INDEPENDENT_AMBULATORY_CARE_PROVIDER_SITE_OTHER): Payer: MEDICAID | Admitting: Primary Care

## 2023-10-31 ENCOUNTER — Telehealth (INDEPENDENT_AMBULATORY_CARE_PROVIDER_SITE_OTHER): Payer: Self-pay | Admitting: Primary Care

## 2023-10-31 NOTE — Telephone Encounter (Signed)
 Copied from CRM [id number]. Topic: Appointments - Appointment Cancel/Reschedule >> Oct 31, 2023  9:08 AM Myrick T wrote: Patient/patient representative is calling to cancel or reschedule an appointment. Refer to attachments for appointment information. Patient called stated she needs to reschedule as she is not feeling well. Please f/u with patient to rescheudule

## 2023-10-31 NOTE — Telephone Encounter (Signed)
 Called pt to confirm atp. Pt will be present

## 2023-11-07 ENCOUNTER — Encounter (INDEPENDENT_AMBULATORY_CARE_PROVIDER_SITE_OTHER): Payer: Self-pay

## 2023-11-07 ENCOUNTER — Ambulatory Visit (INDEPENDENT_AMBULATORY_CARE_PROVIDER_SITE_OTHER): Payer: MEDICAID | Admitting: Primary Care

## 2023-11-08 ENCOUNTER — Ambulatory Visit: Payer: Self-pay | Admitting: General Practice

## 2023-11-08 NOTE — Telephone Encounter (Signed)
Chief Complaint: Abdominal pain Symptoms: Pain radiates to back, left leg Frequency: Intermittent Pertinent Negatives: Patient denies dizziness, lightheadedness, chills, urination pain, vomiting, fever Disposition: [] ED /[x] Urgent Care (no appt availability in office) / [] Appointment(In office/virtual)/ []  [place] [facility]/ [] Home Care/ [] Refused Recommended Disposition /[] [place] [facility]/ []  Follow-up with PCP Additional Notes: Pt states the abdominal pain has been on and off since October with her period. Pt states she has irregular periods. Pt states whenever she gets her period she gets pain in her abdominal area that moves to her spine and left hip. The left hip pain is so severe that she is unable to walk at times. Pt states it locks her leg and the left leg becomes a little numb. Pt states she is currently on her period. Pt states the pain will last for a week after period and then goes away. Pt takes Tylenol which helps some. Pt has not established care yet with [facility] but has an appointment on 3/5 with Caudle, FNP. Pt advised to go to urgent care. Pt states she will take the bus there today. This RN educated pt on home care, new-worsening symptoms, when to call back/seek emergent care. Pt verbalized understanding and agrees to plan.    Copied from CRM [id number]. Topic: Clinical - Red Word Triage >> Nov 08, 2023 10:16 AM Eunice Blase wrote: Red Word that prompted transfer to Nurse Triage: Pt called stated has abdominal pain. Reason for Disposition  [1] SEVERE pain (e.g., excruciating cramps) AND [2] worse than ever before AND [3] not improved with ibuprofen or naproxen  Answer Assessment - Initial Assessment Questions 1. LOCATION: "Where does it hurt?"      Abdomen into back and left hip 2. ONSET: "When did this episode of pain begin?"       Sunday 3. SEVERITY: "How bad is the pain?" "Are you missing school or work because of the pain?"  (e.g., Scale 1-10; mild, moderate,  or severe)   - MILD (1-3): Doesn't interfere with normal activities, lasting 1 to 2 days.    - MODERATE (4-7): Interferes with normal activities (missing work or school), lasting 2 to 3 days, some associated GI symptoms.    - SEVERE (8-10): Excruciating pain, lasting 2-7 days, associated GI symptoms, pain radiating into thighs and back.     7 or 8 4. VAGINAL BLEEDING: "Describe the bleeding that you are having." "How much bleeding is there?"    - SPOTTING: spotting, or pinkish / brownish mucous discharge; does not fill panty liner or pad    - MILD:  less than 1 pad / hour; less than patient's usual menstrual bleeding   - MODERATE: 1-2 pads / hour; 1 menstrual cup every 6 hours; small-medium blood clots (e.g., pea, grape, small coin)   - SEVERE: soaking 2 or more pads/hour for 2 or more hours; 1 menstrual cup every 2 hours; bleeding not contained by pads or continuous red blood from vagina; large blood clots (e.g., golf ball, large coin)      Spotting 5. MENSTRUAL HISTORY:  "When did this menstrual period begin?", "Is this a normal period for you?"       Sunday, yes happens with every period since October 6. LMP:  "When did your last menstrual period begin?"     Sunday 7. OTHER SYMPTOMS: "Do you have any other symptoms?" (e.g., back pain, diarrhea, dizzy or lightheaded, fever, urination pain, vaginal discharge, vomiting)     Back pain, left hip pain  Protocols used: Abdominal Pain - Menstrual Cramps-A-AH

## 2023-11-27 ENCOUNTER — Ambulatory Visit (INDEPENDENT_AMBULATORY_CARE_PROVIDER_SITE_OTHER): Payer: MEDICAID | Admitting: Family Medicine

## 2023-11-27 ENCOUNTER — Encounter (HOSPITAL_BASED_OUTPATIENT_CLINIC_OR_DEPARTMENT_OTHER): Payer: Self-pay | Admitting: Family Medicine

## 2023-11-27 ENCOUNTER — Other Ambulatory Visit (HOSPITAL_COMMUNITY)
Admission: RE | Admit: 2023-11-27 | Discharge: 2023-11-27 | Disposition: A | Discharge status: Home / Self Care | Payer: MEDICAID | Source: Ambulatory Visit | Attending: Family Medicine | Admitting: Family Medicine

## 2023-11-27 VITALS — BP 127/91 | HR 52 | Ht 64.0 in | Wt 132.8 lb

## 2023-11-27 DIAGNOSIS — N926 Irregular menstruation, unspecified: Secondary | ICD-10-CM | POA: Insufficient documentation

## 2023-11-27 DIAGNOSIS — R102 Pelvic and perineal pain: Secondary | ICD-10-CM | POA: Insufficient documentation

## 2023-11-27 DIAGNOSIS — N930 Postcoital and contact bleeding: Secondary | ICD-10-CM | POA: Diagnosis not present

## 2023-11-27 DIAGNOSIS — Z975 Presence of (intrauterine) contraceptive device: Secondary | ICD-10-CM

## 2023-11-27 DIAGNOSIS — Z124 Encounter for screening for malignant neoplasm of cervix: Secondary | ICD-10-CM | POA: Diagnosis not present

## 2023-11-27 MED ORDER — IBUPROFEN 800 MG PO TABS: 800.0000 mg | ORAL_TABLET | Freq: Three times a day (TID) | ORAL | 0 refills | Status: DC | PRN

## 2023-11-27 NOTE — Progress Notes (Signed)
 New Patient Office Visit  Subjective:   Mandy Wilson [birth year] 11/27/2023  Chief Complaint  Patient presents with   New Patient (Initial Visit)    Patient is here today to get established with the practice. States when she has her cycle, she develops headaches and also has severe abdominal pains that will radiate to her back and left hip.    HPI: Mandy Wilson presents today to establish care at [facility] [place] [facility]. Introduced to Publishing rights manager role and practice setting.  All questions answered.    ABNORMAL MENSTRUAL CYCLE  Duration:  Chronic.   Patient reports periods have become more painful after having her children. She compares the pain due to "having contractions". Reports significant headache and severe abdominal pain that impairs her ability to work due to pain and immobility. Reports hx of 4 cesarean section with each pregnancy.   Menarche age: 39 Length of menses: 7 days. Reports period will stop for 2-3 days and then return for 2-3 days before stopping. Reports bleeding after intercourse as well.   Average interval between menses:  Reports usually every 21-24 days but has become more irregular  Flow: heavy initially, then lighter  Contraception: Mirena IUD- has been present for 6 years   Patient's last menstrual period was 11/18/2023 (approximate).  History of sexually transmitted diseases:  Yes, Trichomonas , Chlamydia. Declines testing today.  History GYN procedures: no Abnormal pap smears: no    Dysmenorrhea: yes Intermenstrual bleeding:yes Dyspareunia: yes Postcoital bleeding: yes Abdominal pain: yes Vaginal discharge:no Hirsuitism: no Frequent bruising/mucosal bleeding: no Hot flashes: no   [id number]   The following portions of the patient's history were reviewed and updated as appropriate: past medical history, past surgical history, family history, social history, allergies, medications, and  problem list.   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   Past Medical History:  Diagnosis Date   Anal fissure    recurrent   Anemia    Anxiety    Bipolar 1 disorder (HCC)    Depression    History of anal fissures    Polysubstance abuse (HCC)    cocaine/ marjiuana   Past Surgical History:  Procedure Laterality Date   CESAREAN SECTION  x4  last one 06-19-2017   FRACTURE SURGERY     OPEN REDUCTION INTERNAL FIXATION (ORIF) DISTAL PHALANX Right 09/02/2019   Procedure: OPEN REDUCTION INTERNAL FIXATION (ORIF) right long finger malunion repair;  Surgeon: Bradly Bienenstock, MD;  Location: [facility];  Service: Orthopedics;  Laterality: Right;   RECTAL EXAM UNDER ANESTHESIA N/A 02/17/2020   Procedure: ANAL EXAM UNDER ANESTHESIA;  Surgeon: Romie Levee, MD;  Location: [facility] [place];  Service: General;  Laterality: N/A;   SPHINCTEROTOMY N/A 02/17/2020   Procedure: CHEMICAL SPHINCTEROTOMY BOTOX, HEMORRHOIDPEXY;  Surgeon: Romie Levee, MD;  Location: [facility];  Service: General;  Laterality: N/A;   SPHINCTEROTOMY N/A 05/25/2020   Procedure: LATERAL INTERNAL SPHINCTEROTOMY;  Surgeon: Romie Levee, MD;  Location: [facility] [place];  Service: General;  Laterality: N/A;   Family History  Problem Relation Age of Onset   Asthma Mother    Anxiety disorder Mother    Depression Mother    Heart disease Mother    Asthma Father    Social History   Socioeconomic History   Marital status: Single  Spouse name: Not on file   Number of children: Not on file   Years of education: Not on file   Highest education level: GED or equivalent  Occupational History   Not on file  Tobacco Use   Smoking status: Every Day    Current packs/day: 0.50    Average packs/day: 0.5 packs/day for 12.0 years (6.0 ttl  pk-yrs)    Types: Cigarettes   Smokeless tobacco: Never  Vaping Use   Vaping status: Never Used  Substance and Sexual Activity   Alcohol use: Yes    Comment: pint some days not every day, social   Drug use: Yes    Types: "Crack" cocaine, Marijuana    Comment: 05-24-2020 per pt cocaine last use 02-09-2020, marijuana use every day   Sexual activity: Not on file  Other Topics Concern   Not on file  Social History Narrative   Not on file   Social Drivers of Health   Financial Resource Strain: High Risk (10/31/2023)   Overall Financial Resource Strain (CARDIA)    Difficulty of Paying Living Expenses: Hard  Food Insecurity: Food Insecurity Present (10/31/2023)   Hunger Vital Sign    Worried About Running Out of Food in the Last Year: Sometimes true    Ran Out of Food in the Last Year: Sometimes true  Transportation Needs: Unmet Transportation Needs (10/31/2023)   PRAPARE - Transportation    Lack of Transportation (Medical): Yes    Lack of Transportation (Non-Medical): Yes  Physical Activity: Insufficiently Active (10/31/2023)   Exercise Vital Sign    Days of Exercise per Week: 2 days    Minutes of Exercise per Session: 30 min  Stress: Stress Concern Present (10/31/2023)   [employer] of Occupational Health - Occupational Stress Questionnaire    Feeling of Stress : Very much  Social Connections: Moderately Isolated (10/31/2023)   Social Connection and Isolation Panel [NHANES]    Frequency of Communication with Friends and Family: Twice a week    Frequency of Social Gatherings with Friends and Family: Once a week    Attends Religious Services: More than 4 times per year    Active Member of [employer] or Organizations: No    Attends Engineer, structural: Not on file    Marital Status: Never married  Intimate Partner Violence: Not At Risk (11/27/2023)   Humiliation, Afraid, Rape, and Kick questionnaire    Fear of Current or Ex-Partner: No    Emotionally Abused: No    Physically  Abused: No    Sexually Abused: No   Outpatient Medications Prior to Visit  Medication Sig Dispense Refill   levonorgestrel (MIRENA) 20 MCG/24HR IUD 1 each by Intrauterine route once. Inserted 2018     benzocaine (AMERICAINE) 20 % rectal ointment Place rectally every 3 (three) hours as needed for pain. 28.4 g 2   docusate sodium (COLACE) 100 MG capsule Take 1 capsule (100 mg total) by mouth every 12 (twelve) hours. (Patient taking differently: Take 100 mg by mouth 2 (two) times daily as needed. ) 60 capsule 2   HYDROcodone-acetaminophen (NORCO/VICODIN) 5-325 MG tablet Take 1-2 tablets by mouth every 6 (six) hours as needed. 30 tablet 0   ibuprofen (ADVIL) 400 MG tablet Take 1 tablet (400 mg total) by mouth every 6 (six) hours as needed. 30 tablet 0   lidocaine (LIDODERM) 5 % Place 1 patch onto the skin daily. Remove & Discard patch within 12 hours or as directed by MD 30 patch 0  QUEtiapine (SEROQUEL) 50 MG tablet Take 50 mg by mouth 2 (two) times daily.      No facility-administered medications prior to visit.   Allergies  Allergen Reactions   Shellfish Allergy Hives    Throat gets tight does not close all the way    ROS: A complete ROS was performed with pertinent positives/negatives noted in the HPI. The remainder of the ROS are negative.   Objective:   Today's Vitals   11/27/23 0805  BP: (!) 127/91  Pulse: (!) 52  SpO2: 100%  Weight: 132 lb 12.8 oz (60.2 kg)  Height: 5\' 4"  (1.626 m)    GENERAL: Well-appearing, in NAD. Well nourished.  SKIN: Pink, warm and dry.  Head: Normocephalic. NECK: Trachea midline. Full ROM w/o pain or tenderness.  RESPIRATORY: Chest wall symmetrical. Respirations even and non-labored.  GI: Abdomen soft, mild suprapubic tenderness. Normoactive bowel sounds. No rebound tenderness. No hepatomegaly or splenomegaly. No CVA tenderness.  GU: External genitalia without erythema, lesions, or masses. No lymphadenopathy. Vaginal mucosa pink and moist without  exudate, lesions, or ulcerations. Cervix pink without discharge. Cervical os closed. IUD String visualized. Uterus and adnexae palpable, not enlarged, and w/o tenderness. No palpable masses. Chaperoned by Cristy Hilts, CMA MSK: Muscle tone and strength appropriate for age.  NEUROLOGIC: No motor or sensory deficits. Steady, even gait. C2-C12 intact.  PSYCH/MENTAL STATUS: Alert, oriented x 3. Cooperative, appropriate mood and affect.     Assessment & Plan:  1. Pelvic pain (Primary) Discussed possible causes with patient including STI, inflammation, fibroid, and cysts. Vaginal swab obtained during pap smear and will notify if infection is present. Recommend completion of US Pelvis/Transvag to rule out possible cyst or fibroid presence. Referral placed to OBGYN for IUD removal and follow up. Recommend patient use Ibuprofen prior to week of menses to help with cramping.   - US Pelvic Complete With Transvaginal; Future - Ambulatory referral to Obstetrics / Gynecology - ibuprofen (ADVIL) 800 MG tablet; Take 1 tablet (800 mg total) by mouth every 8 (eight) hours as needed for cramping.  Dispense: 30 tablet; Refill: 0 - Cervicovaginal ancillary only - Cytology - PAP  2. IUD (intrauterine device) in place Pt requesting removal. Referral to OBGYN placed.  - Ambulatory referral to Obstetrics / Gynecology  3. Postcoital bleeding Will assess for possible STI or infection present causing irritation and inflammation exacerbated with intercourse.  - Cervicovaginal ancillary only  4. Encounter for Papanicolaou smear for cervical cancer screening - Cytology - PAP   Patient to reach out to office if new, worrisome, or unresolved symptoms arise or if no improvement in patient's condition. Patient verbalized understanding and is agreeable to treatment plan. All questions answered to patient's satisfaction.   A total of 45 minutes were spent on this encounter today including face to face, ordering and  reviewing labs, reviewing patient's previous records from  OBGYN, documenting in the record and ordering  labs, medications and screenings.    Return in about 2 months (around 01/27/2024) for ANNUAL PHYSICAL, Follow up Irregular Periods .    Hilbert Bible, [place]

## 2023-11-28 LAB — CERVICOVAGINAL ANCILLARY ONLY
Bacterial Vaginitis (gardnerella): NEGATIVE
Candida Glabrata: NEGATIVE
Candida Vaginitis: NEGATIVE
Chlamydia: NEGATIVE
Comment: NEGATIVE
Comment: NEGATIVE
Comment: NEGATIVE
Comment: NEGATIVE
Comment: NEGATIVE
Comment: NORMAL
Neisseria Gonorrhea: POSITIVE — AB
Trichomonas: NEGATIVE

## 2023-11-29 NOTE — Progress Notes (Signed)
 Please let patient know that her cervical swab came back positive for gonorrhea.  We do need to treat her with an IM injection of Rocephin here in the office.  If she is able to get that done today that is fine or we can do this on Monday.

## 2023-12-01 ENCOUNTER — Other Ambulatory Visit (HOSPITAL_BASED_OUTPATIENT_CLINIC_OR_DEPARTMENT_OTHER): Payer: Self-pay | Admitting: Family Medicine

## 2023-12-01 DIAGNOSIS — A549 Gonococcal infection, unspecified: Secondary | ICD-10-CM | POA: Insufficient documentation

## 2023-12-01 MED ORDER — CEFTRIAXONE SODIUM 500 MG IJ SOLR
500.0000 mg | Freq: Once | INTRAMUSCULAR | Status: AC
  Administered 2023-12-02: 500 mg via INTRAMUSCULAR

## 2023-12-02 ENCOUNTER — Encounter (HOSPITAL_BASED_OUTPATIENT_CLINIC_OR_DEPARTMENT_OTHER): Payer: Self-pay | Admitting: Family Medicine

## 2023-12-02 ENCOUNTER — Ambulatory Visit (HOSPITAL_BASED_OUTPATIENT_CLINIC_OR_DEPARTMENT_OTHER): Payer: MEDICAID

## 2023-12-02 DIAGNOSIS — A549 Gonococcal infection, unspecified: Secondary | ICD-10-CM

## 2023-12-02 LAB — CYTOLOGY - PAP
Comment: NEGATIVE
Diagnosis: NEGATIVE
Diagnosis: REACTIVE
High risk HPV: NEGATIVE

## 2023-12-02 NOTE — Progress Notes (Signed)
 Patient is in office today for a nurse visit for  Rocephin injection . Patient Injection was given in the  Right upper quad. gluteus. Patient tolerated injection well.

## 2023-12-02 NOTE — Patient Instructions (Signed)
 Patient is in office today for a nurse visit for  rocephin 500 mg  . Patient Injection was given in the  Left upper quad. gluteus. Patient tolerated injection well.

## 2023-12-02 NOTE — Progress Notes (Signed)
 Pap Smear is normal. Health maintenance updated. Repeat pap in 3 years.

## 2023-12-09 ENCOUNTER — Ambulatory Visit (HOSPITAL_BASED_OUTPATIENT_CLINIC_OR_DEPARTMENT_OTHER)
Admission: RE | Admit: 2023-12-09 | Discharge: 2023-12-09 | Disposition: A | Discharge status: Home / Self Care | Payer: MEDICAID | Source: Ambulatory Visit | Attending: Family Medicine | Admitting: Family Medicine

## 2023-12-09 DIAGNOSIS — R102 Pelvic and perineal pain unspecified side: Secondary | ICD-10-CM

## 2023-12-18 ENCOUNTER — Encounter (HOSPITAL_BASED_OUTPATIENT_CLINIC_OR_DEPARTMENT_OTHER): Payer: Self-pay | Admitting: Family Medicine

## 2023-12-18 NOTE — Progress Notes (Signed)
 Hi Mandy Wilson,  Your ultrasound is normal. No signs of further issues. Your pain should resolve with clearance of the infection with antibiotics.

## 2023-12-31 ENCOUNTER — Ambulatory Visit (HOSPITAL_BASED_OUTPATIENT_CLINIC_OR_DEPARTMENT_OTHER): Payer: MEDICAID | Admitting: Certified Nurse Midwife

## 2023-12-31 ENCOUNTER — Encounter (HOSPITAL_BASED_OUTPATIENT_CLINIC_OR_DEPARTMENT_OTHER): Payer: Self-pay | Admitting: Certified Nurse Midwife

## 2023-12-31 VITALS — BP 141/81 | HR 63 | Ht 64.0 in | Wt 132.2 lb

## 2023-12-31 DIAGNOSIS — Z30432 Encounter for removal of intrauterine contraceptive device: Secondary | ICD-10-CM

## 2023-12-31 NOTE — Progress Notes (Signed)
  GYNECOLOGY  VISIT  CC:   Here for Mirena IUD removal  HPI: 38 y.o. [id number] Single Black or African American female here for removal of Mirena IUD. She would like to start "pill" after removal of Mirena.    Past Medical History:  Diagnosis Date   Anal fissure    recurrent   Anemia    Anxiety    Bipolar 1 disorder (HCC)    Depression    History of anal fissures    Polysubstance abuse (HCC)    cocaine/ marjiuana    MEDS:   Current Outpatient Medications on File Prior to Visit  Medication Sig Dispense Refill   ibuprofen (ADVIL) 800 MG tablet Take 1 tablet (800 mg total) by mouth every 8 (eight) hours as needed for cramping. 30 tablet 0   levonorgestrel (MIRENA) 20 MCG/24HR IUD 1 each by Intrauterine route once. Inserted 2018     No current facility-administered medications on file prior to visit.    ALLERGIES: Shellfish allergy  PHYSICAL EXAMINATION:    BP (!) 141/81 (BP Location: Left Arm, Patient Position: Sitting, Cuff Size: Normal)   Pulse 63   Ht 5\' 4"  (1.626 m)   Wt 132 lb 3.2 oz (60 kg)   LMP 12/17/2023 (Approximate)   BMI 22.69 kg/m     General appearance: alert, cooperative and appears stated age  Pelvic: External genitalia:  no lesions              Urethra:  normal appearing urethra with no masses, tenderness or lesions              Bartholins and Skenes: normal                 Vagina: normal mucosa without prolapse or lesions              Cervix: no cervical motion tenderness, no lesions, nulliparous appearance, and IUD strings are not visible           Chaperone, Manning, CMA, was present for exam.  Assessment/Plan:  1. Encounter for removal of intrauterine contraceptive device (Primary) - IUD strings not visible today. Cervix nulliparous appearing (Hx CS x 4).  CNM did attempt to grasp inside cervical os with sterile forcep but unable to successfully grasp IUD strings for removal. Patient aware she will need to RTO for further evaluation. Pt will RTO  for evaluation by Dr. Hyacinth Meeker. She may attempt IUD removal at that time or may recommend IUD removal via Hysteroscopy.   Discussed other options for contraception. Pt interested in a pill. She is a tobacco-user and is not a candidate for estrogen-containing COC. Discussed Progesterone-Only Pill would need to be taken at same time daily. Pt verbalizes understanding and is interested in starting POP once Mirena IUD removed.  Letta Kocher

## 2024-01-01 ENCOUNTER — Ambulatory Visit (INDEPENDENT_AMBULATORY_CARE_PROVIDER_SITE_OTHER): Payer: MEDICAID | Admitting: Obstetrics & Gynecology

## 2024-01-01 ENCOUNTER — Encounter (HOSPITAL_BASED_OUTPATIENT_CLINIC_OR_DEPARTMENT_OTHER): Payer: Self-pay | Admitting: Obstetrics & Gynecology

## 2024-01-01 ENCOUNTER — Ambulatory Visit (HOSPITAL_BASED_OUTPATIENT_CLINIC_OR_DEPARTMENT_OTHER): Payer: MEDICAID

## 2024-01-01 VITALS — BP 129/86 | HR 57 | Ht 64.0 in | Wt 132.0 lb

## 2024-01-01 DIAGNOSIS — T8332XD Displacement of intrauterine contraceptive device, subsequent encounter: Secondary | ICD-10-CM

## 2024-01-01 DIAGNOSIS — Z30432 Encounter for removal of intrauterine contraceptive device: Secondary | ICD-10-CM

## 2024-01-01 MED ORDER — KETOROLAC TROMETHAMINE 30 MG/ML IJ SOLN
30.0000 mg | Freq: Once | INTRAMUSCULAR | Status: AC
  Administered 2024-01-01: 30 mg via INTRAMUSCULAR

## 2024-01-01 MED ORDER — KETOROLAC TROMETHAMINE 30 MG/ML IJ SOLN: 30.0000 mg | Freq: Once | INTRAMUSCULAR | Status: DC

## 2024-01-03 NOTE — Progress Notes (Signed)
 GYNECOLOGY  VISIT  CC:   IUD removal  HPI: 38 y.o. [id number] Single Black or African American female here for IUD removal.  She was recently seen by Merrilee Jansky, CNM, and IUD strings were not seen.  Patient was scheduled for follow-up with me.  Discussed with patient I would like to try and do this with ultrasound guidance.  Ultrasound schedule could accommodate that today.  Patient does not want any hormonal method for contraception at this time.   Past Medical History:  Diagnosis Date   Anal fissure    recurrent   Anemia    Anxiety    Bipolar 1 disorder (HCC)    Depression    History of anal fissures    Polysubstance abuse (HCC)    cocaine/ marjiuana    MEDS:   Current Outpatient Medications on File Prior to Visit  Medication Sig Dispense Refill   levonorgestrel (MIRENA) 20 MCG/24HR IUD 1 each by Intrauterine route once. Inserted 2018     ibuprofen (ADVIL) 800 MG tablet Take 1 tablet (800 mg total) by mouth every 8 (eight) hours as needed for cramping. (Patient not taking: Reported on 01/01/2024) 30 tablet 0   No current facility-administered medications on file prior to visit.    ALLERGIES: Shellfish allergy  SH: Single  Review of Systems  Constitutional: Negative.   Genitourinary: Negative.     PHYSICAL EXAMINATION:    BP 129/86 (BP Location: Left Arm, Patient Position: Sitting, Cuff Size: Normal)   Pulse (!) 57   Ht 5\' 4"  (1.626 m)   Wt 132 lb (59.9 kg)   LMP 12/17/2023 (Approximate)   BMI 22.66 kg/m     General appearance: alert, cooperative and appears stated age   Pelvic: External genitalia:  no lesions              Urethra:  normal appearing urethra with no masses, tenderness or lesions              Bartholins and Skenes: normal                 Vagina:  Normal in appearance              Cervix: no lesions and no IUD string noted              Bimanual Exam:  Uterus:  normal size, contour, position, consistency, mobility, non-tender            Procedure:  Ultrasound was performed that showed IUD string about a centimeter and a half from the cervical os.  Speculum placed.  Cervix cleansed with Betadine.  Single-tooth tenaculum placed on the edge of the cervix.  Tonsil forceps passed in the cervical os, which was parous, and IUD string was grasped.  With 1 pull, 1 string came off the IUD.  Patient desired to continue to cervix was dilated with Milex dilator.  Then metal suction curette tip was passed into the endometrium under ultrasound guidance this was used to as traction which was then delivered through the os intact with the second string attached.  Patient visualized this before was discarded.  Patient did have some significant cramping after the procedure and was given IM Toradol.  She was improved prior to leaving the office.  Chaperone, Bonnita Hollow, CMA, was present for exam.  Assessment/Plan: 1. Intrauterine contraceptive device threads lost, subsequent encounter (Primary) - US PELVIS TRANSVAGINAL NON-OB (TV ONLY); Future  2. Encounter for IUD removal - successful IUD removal completed  today

## 2024-01-29 ENCOUNTER — Encounter (HOSPITAL_BASED_OUTPATIENT_CLINIC_OR_DEPARTMENT_OTHER): Payer: MEDICAID | Admitting: Family Medicine

## 2024-03-02 ENCOUNTER — Encounter (HOSPITAL_BASED_OUTPATIENT_CLINIC_OR_DEPARTMENT_OTHER): Payer: Self-pay | Admitting: Family Medicine

## 2024-03-02 ENCOUNTER — Ambulatory Visit (INDEPENDENT_AMBULATORY_CARE_PROVIDER_SITE_OTHER): Payer: MEDICAID | Admitting: Family Medicine

## 2024-03-02 ENCOUNTER — Other Ambulatory Visit (HOSPITAL_COMMUNITY)
Admission: RE | Admit: 2024-03-02 | Discharge: 2024-03-02 | Disposition: A | Discharge status: Home / Self Care | Payer: MEDICAID | Source: Ambulatory Visit | Attending: Family Medicine | Admitting: Family Medicine

## 2024-03-02 VITALS — BP 130/97 | HR 63 | Ht 64.0 in | Wt 123.8 lb

## 2024-03-02 DIAGNOSIS — Z114 Encounter for screening for human immunodeficiency virus [HIV]: Secondary | ICD-10-CM | POA: Diagnosis not present

## 2024-03-02 DIAGNOSIS — G4700 Insomnia, unspecified: Secondary | ICD-10-CM

## 2024-03-02 DIAGNOSIS — Z1159 Encounter for screening for other viral diseases: Secondary | ICD-10-CM | POA: Diagnosis not present

## 2024-03-02 DIAGNOSIS — Z202 Contact with and (suspected) exposure to infections with a predominantly sexual mode of transmission: Secondary | ICD-10-CM

## 2024-03-02 DIAGNOSIS — Z0000 Encounter for general adult medical examination without abnormal findings: Secondary | ICD-10-CM | POA: Diagnosis not present

## 2024-03-02 DIAGNOSIS — I10 Essential (primary) hypertension: Secondary | ICD-10-CM | POA: Insufficient documentation

## 2024-03-02 NOTE — Patient Instructions (Signed)
 Sleep 3 extended release melatonin

## 2024-03-02 NOTE — Progress Notes (Addendum)
 Subjective:   Mandy Wilson 1986/03/05  03/02/2024   CC: Chief Complaint  Patient presents with   Annual Exam    Patient is here today for her physical. Wants to have full STD testing done. Also states she has been fatigued and has been having problems sleeping at night.    HPI: Mandy Wilson is a 38 y.o. female who presents for a routine health maintenance exam.  Labs collected at time of visit.    HEALTH SCREENINGS: - Vision Screening: Recommended - Dental Visits: yes - Pap smear: up to date - Breast Exam: up to date - STD Screening: Ordered today, states she was intimate with a partner last week whom she states gave her an STD in the past. She reports vaginal itching since onset of sx.  - Mammogram (40+): Not applicable  - Colonoscopy (45+): states her father may  have had colon cancer vs prostate cancer - Bone Density (65+ or under 43 with predisposing conditions): Not applicable  - Lung CA screening with low-dose CT:  Not applicable Adults age 9-80 who are current cigarette smokers or quit within the last 15 years. Must have 20 pack year history.   Depression and Anxiety Screen done today and results listed below:     03/02/2024    9:53 AM 12/31/2023    1:59 PM 11/27/2023    8:10 AM  Depression screen PHQ 2/9  Decreased Interest 2 0 1  Down, Depressed, Hopeless 3 0 1  PHQ - 2 Score 5 0 2  Altered sleeping 3  3  Tired, decreased energy 3  2  Change in appetite 0  0  Feeling bad or failure about yourself  2  0  Trouble concentrating 2  2  Moving slowly or fidgety/restless 0  0  Suicidal thoughts 0  0  PHQ-9 Score 15  9  Difficult doing work/chores Very difficult  Somewhat difficult      03/02/2024    9:54 AM 11/27/2023    8:13 AM  GAD 7 : Generalized Anxiety Score  Nervous, Anxious, on Edge 3 1  Control/stop worrying 3 2  Worry too much - different things 3 3  Trouble relaxing 3 3  Restless 0 0  Easily annoyed or irritable 3 3  Afraid - awful might happen 0  0  Total GAD 7 Score 15 12  Anxiety Difficulty Not difficult at all Not difficult at all    IMMUNIZATIONS: - Tdap: Tetanus vaccination status reviewed: last tetanus booster within 10 years. - HPV: unsure if she's had this, states she grew up in [place] - Influenza: Postponed to flu season - Pneumovax: Not applicable - Prevnar 20: Not applicable - Shingrix (50+): Not applicable  INSOMNIA:  Pt also reports trouble sleeping over the last month. She reports recent stress d/t breaking up with her boyfriend. She reports drinking alcohol at night to go to sleep, and wakes up between 2-3am every morning; sometimes she's able to fall back asleep, and sometimes she is awake until her alarm goes off at 0500. She has not tried anything OTC for sleep.   Past medical history, surgical history, medications, allergies, family history and social history reviewed with patient today and changes made to appropriate areas of the chart.   Past Medical History:  Diagnosis Date   Anal fissure    recurrent   Anemia    Anxiety    Bipolar 1 disorder (HCC)    Depression    History of anal fissures  Polysubstance abuse (HCC)    cocaine/ marjiuana   Substance abuse (HCC)     Past Surgical History:  Procedure Laterality Date   CESAREAN SECTION  x4  last one 06-19-2017   FRACTURE SURGERY     OPEN REDUCTION INTERNAL FIXATION (ORIF) DISTAL PHALANX Right 09/02/2019   Procedure: OPEN REDUCTION INTERNAL FIXATION (ORIF) right long finger malunion repair;  Surgeon: Arvil Birks, MD;  Location: [facility];  Service: Orthopedics;  Laterality: Right;   RECTAL EXAM UNDER ANESTHESIA N/A 02/17/2020   Procedure: ANAL EXAM UNDER ANESTHESIA;  Surgeon: Joyce Nixon, MD;  Location: [facility];  Service: General;  Laterality: N/A;   SPHINCTEROTOMY N/A 02/17/2020   Procedure: CHEMICAL SPHINCTEROTOMY BOTOX , HEMORRHOIDPEXY;  Surgeon: Joyce Nixon, MD;  Location: [facility];   Service: General;  Laterality: N/A;   SPHINCTEROTOMY N/A 05/25/2020   Procedure: LATERAL INTERNAL SPHINCTEROTOMY;  Surgeon: Joyce Nixon, MD;  Location: [facility];  Service: General;  Laterality: N/A;    No current outpatient medications on file prior to visit.   No current facility-administered medications on file prior to visit.    Allergies  Allergen Reactions   Shellfish Allergy Hives    Lips tingle, facial swelling     Social History   Socioeconomic History   Marital status: Single    Spouse name: Not on file   Number of children: Not on file   Years of education: Not on file   Highest education level: GED or equivalent  Occupational History   Not on file  Tobacco Use   Smoking status: Every Day    Current packs/day: 0.50    Average packs/day: 0.5 packs/day for 12.0 years (6.0 ttl pk-yrs)    Types: Cigarettes   Smokeless tobacco: Never  Vaping Use   Vaping status: Never Used  Substance and Sexual Activity   Alcohol use: Yes    Comment: pint some days not every day, social   Drug use: Yes    Types: "Crack" cocaine, Marijuana    Comment: 05-24-2020 per pt cocaine last use 02-09-2020, marijuana use every day   Sexual activity: Not on file  Other Topics Concern   Not on file  Social History Narrative   Not on file   Social Drivers of Health   Financial Resource Strain: High Risk (10/31/2023)   Overall Financial Resource Strain (CARDIA)    Difficulty of Paying Living Expenses: Hard  Food Insecurity: Food Insecurity Present (10/31/2023)   Hunger Vital Sign    Worried About Running Out of Food in the Last Year: Sometimes true    Ran Out of Food in the Last Year: Sometimes true  Transportation Needs: Unmet Transportation Needs (10/31/2023)   PRAPARE - Transportation    Lack of Transportation (Medical): Yes    Lack of Transportation (Non-Medical): Yes  Physical Activity: Insufficiently Active (10/31/2023)   Exercise Vital Sign    Days of Exercise  per Week: 2 days    Minutes of Exercise per Session: 30 min  Stress: Stress Concern Present (10/31/2023)   [employer] of Occupational Health - Occupational Stress Questionnaire    Feeling of Stress : Very much  Social Connections: Moderately Isolated (10/31/2023)   Social Connection and Isolation Panel [NHANES]    Frequency of Communication with Friends and Family: Twice a week    Frequency of Social Gatherings with Friends and Family: Once a week    Attends Religious Services: More than 4 times per year  Active Member of Clubs or Organizations: No    Attends Banker Meetings: Not on file    Marital Status: Never married  Intimate Partner Violence: Not At Risk (11/27/2023)   Humiliation, Afraid, Rape, and Kick questionnaire    Fear of Current or Ex-Partner: No    Emotionally Abused: No    Physically Abused: No    Sexually Abused: No   Social History   Tobacco Use  Smoking Status Every Day   Current packs/day: 0.50   Average packs/day: 0.5 packs/day for 12.0 years (6.0 ttl pk-yrs)   Types: Cigarettes  Smokeless Tobacco Never   Social History   Substance and Sexual Activity  Alcohol Use Yes   Comment: pint some days not every day, social    Family History  Problem Relation Age of Onset   Asthma Mother    Anxiety disorder Mother    Depression Mother    Heart disease Mother    Asthma Father      ROS: Denies fever, fatigue, unexplained weight loss/gain, chest pain, SHOB, and palpitations. Denies neurological deficits, gastrointestinal or genitourinary complaints, and skin changes.   Objective:   Today's Vitals   03/02/24 0945 03/02/24 1033  BP: (!) 132/99 (!) 130/97  Pulse: 63   SpO2: 100%   Weight: 123 lb 12.8 oz (56.2 kg)   Height: 5\' 4"  (1.626 m)     GENERAL APPEARANCE: Well-appearing, in NAD. Well nourished.  SKIN: Pink, warm and dry. Turgor normal. No rash, lesion, ulceration, or ecchymoses. Hair evenly distributed.  HEENT: HEAD:  Normocephalic.  EYES: PERRLA. EOMI. Lids intact w/o defect. Sclera white, Conjunctiva pink w/o exudate.  EARS: External ear w/o redness, swelling, masses or lesions. EAC clear. TM's intact, translucent w/o bulging, appropriate landmarks visualized. Appropriate acuity to conversational tones.  NOSE: Septum midline w/o deformity. Nares patent, mucosa pink and non-inflamed w/o drainage. No sinus tenderness.  THROAT: Uvula midline. Oropharynx clear. Tonsils non-inflamed w/o exudate. Oral mucosa pink and moist.  NECK: Supple, Trachea midline. Full ROM w/o pain or tenderness. No lymphadenopathy. Thyroid non-tender w/o enlargement or palpable masses.  RESPIRATORY: Chest wall symmetrical w/o masses. Respirations even and non-labored. Breath sounds clear to auscultation bilaterally. No wheezes, rales, rhonchi, or crackles. CARDIAC: S1, S2 present, regular rate and rhythm. No gallops, murmurs, rubs, or clicks. PMI w/o lifts, heaves, or thrills. No carotid bruits. Capillary refill <2 seconds. Peripheral pulses 2+ bilaterally. GI: Abdomen soft w/o distention. Normoactive bowel sounds. No palpable masses or tenderness. No guarding or rebound tenderness. Liver and spleen w/o tenderness or enlargement. No CVA tenderness.  GU: Pt declined exam.  MSK: Muscle tone and strength appropriate for age, w/o atrophy or abnormal movement.  EXTREMITIES: Active ROM intact, w/o tenderness, crepitus, or contracture. No obvious joint deformities or effusions. No clubbing, edema, or cyanosis.  NEUROLOGIC: CN's II-XII intact. Motor strength symmetrical with no obvious weakness. No sensory deficits. DTR's 2+ symmetric bilaterally. Steady, even gait.  PSYCH/MENTAL STATUS: Alert, oriented x 3. Cooperative, appropriate mood and affect.   Results for orders placed or performed in visit on 11/27/23  Cervicovaginal ancillary only   Collection Time: 11/27/23  8:37 AM  Result Value Ref Range   Neisseria Gonorrhea Positive (A)     Chlamydia Negative    Trichomonas Negative    Bacterial Vaginitis (gardnerella) Negative    Candida Vaginitis Negative    Candida Glabrata Negative    Comment      Normal Reference Range Bacterial Vaginosis - Negative   Comment  Normal Reference Range Candida Species - Negative    Comment Normal Reference Range Candida Galbrata - Negative    Comment Normal Reference Range Trichomonas - Negative    Comment Normal Reference Ranger Chlamydia - Negative    Comment      Normal Reference Range Neisseria Gonorrhea - Negative  Cytology - PAP   Collection Time: 11/27/23  8:41 AM  Result Value Ref Range   High risk HPV Negative    Adequacy      Satisfactory for evaluation; transformation zone component PRESENT.   Diagnosis      - Negative for Intraepithelial Lesions or Malignancy (NILM)   Diagnosis - Benign reactive/reparative changes    Comment Normal Reference Range HPV - Negative     Assessment & Plan:  1. Annual physical exam (Primary) Reviewed preventative screenings, vaccinations, and will obtain fastin glab work today. Discussed healthy lifestyle changes, discussed HPV vaccines. Pt declined to discuss GAD7 or PHQ9 result.  - CBC with Differential/Platelet - Comprehensive metabolic panel with GFR - Lipid panel - TSH  2. Encounter for assessment of sexually transmitted disease exposure Asymptomatic, pt requested testing.  - RPR - Cervicovaginal ancillary only  3. Encounter for screening for HIV Asymptomatic, pt requested testing. - HIV Antibody (routine testing w rflx)  4. Encounter for hepatitis C screening test for low risk patient Asymptomatic, pt requested testing. - Hepatitis C antibody  5. Essential hypertension Pt has had three elevated BP reading within the last two visits. Discussed healthy lifestyle changes including diet and exercise vs starting a low dose blood pressure medication. Pt elected to work on diet and lifestyle changes. Plan is for pt RTO in 73mo for BP  recheck.   6. Insomnia Discussed sleep hygiene with pt, including avoiding bright screens and/or blue light from cell phone before bed, reducing alcohol intake as that can interfere with sleep quality. Suggested OTC Sleep-3 sleep aid to aid with trouble sleeping.    Orders Placed This Encounter  Procedures   CBC with Differential/Platelet   Comprehensive metabolic panel with GFR   Lipid panel   TSH   RPR   Hepatitis C antibody   HIV Antibody (routine testing w rflx)    PATIENT COUNSELING:  - Encouraged a healthy well-balanced diet. Patient may adjust caloric intake to maintain or achieve ideal body weight. May reduce intake of dietary saturated fat and total fat and have adequate dietary potassium and calcium preferably from fresh fruits, vegetables, and low-fat dairy products.   - Advised to avoid cigarette smoking. - Discussed with the patient that most people either abstain from alcohol or drink within safe limits (<=14/week and <=4 drinks/occasion for males, <=7/weeks and <= 3 drinks/occasion for females) and that the risk for alcohol disorders and other health effects rises proportionally with the number of drinks per week and how often a drinker exceeds daily limits. - Discussed cessation/primary prevention of drug use and availability of treatment for abuse.  - Discussed sexually transmitted diseases, avoidance of unintended pregnancy and contraceptive alternatives.  - Stressed the importance of regular exercise - Injury prevention: Discussed safety belts, safety helmets, smoke detector, smoking near bedding or upholstery.  - Dental health: Discussed importance of regular tooth brushing, flossing, and dental visits.   NEXT PREVENTATIVE PHYSICAL DUE IN 1 YEAR.  Return for 3 months: BP Check only; 1 year AE (fasting labs at visit) .  Patient to reach out to office if new, worrisome, or unresolved symptoms arise or if no improvement in  patient's condition. Patient verbalized  understanding and is agreeable to treatment plan. All questions answered to patient's satisfaction.   Nonda Bays, [place]

## 2024-03-02 NOTE — Progress Notes (Addendum)
 Subjective:   Mandy Wilson 06-21-1986  03/02/2024   CC: Chief Complaint  Patient presents with   Annual Exam    Patient is here today for her physical. Wants to have full STD testing done. Also states she has been fatigued and has been having problems sleeping at night.    HPI: Mandy Wilson is a 38 y.o. female who presents for a routine health maintenance exam.  Labs collected at time of visit.    HEALTH SCREENINGS: - Vision Screening: Recommended - Dental Visits: yes - Pap smear: up to date - Breast Exam: up to date - STD Screening: Ordered today, states she was intimate with a partner last week whom she states gave her an STD in the past. She reports vaginal itching since onset of sx.  - Mammogram (40+): Not applicable  - Colonoscopy (45+): states her father may  have had colon cancer vs prostate cancer - Bone Density (65+ or under 95 with predisposing conditions): Not applicable  - Lung CA screening with low-dose CT:  Not applicable Adults age 53-80 who are current cigarette smokers or quit within the last 15 years. Must have 20 pack year history.   Depression and Anxiety Screen done today and results listed below:     03/02/2024    9:53 AM 12/31/2023    1:59 PM 11/27/2023    8:10 AM  Depression screen PHQ 2/9  Decreased Interest 2 0 1  Down, Depressed, Hopeless 3 0 1  PHQ - 2 Score 5 0 2  Altered sleeping 3  3  Tired, decreased energy 3  2  Change in appetite 0  0  Feeling bad or failure about yourself  2  0  Trouble concentrating 2  2  Moving slowly or fidgety/restless 0  0  Suicidal thoughts 0  0  PHQ-9 Score 15  9  Difficult doing work/chores Very difficult  Somewhat difficult      03/02/2024    9:54 AM 11/27/2023    8:13 AM  GAD 7 : Generalized Anxiety Score  Nervous, Anxious, on Edge 3 1  Control/stop worrying 3 2  Worry too much - different things 3 3  Trouble relaxing 3 3  Restless 0 0  Easily annoyed or irritable 3 3  Afraid - awful might happen 0  0  Total GAD 7 Score 15 12  Anxiety Difficulty Not difficult at all Not difficult at all    IMMUNIZATIONS: - Tdap: Tetanus vaccination status reviewed: last tetanus booster within 10 years. - HPV: unsure if she's had this, states she grew up in [place] - Influenza: Postponed to flu season - Pneumovax: Not applicable - Prevnar 20: Not applicable - Shingrix (50+): Not applicable  INSOMNIA:  Pt also reports trouble sleeping over the last month. She reports recent stress d/t breaking up with her boyfriend. She reports drinking alcohol at night to go to sleep, and wakes up between 2-3am every morning; sometimes she's able to fall back asleep, and sometimes she is awake until her alarm goes off at 0500. She has not tried anything OTC for sleep.   Past medical history, surgical history, medications, allergies, family history and social history reviewed with patient today and changes made to appropriate areas of the chart.   Past Medical History:  Diagnosis Date   Anal fissure    recurrent   Anemia    Anxiety    Bipolar 1 disorder (HCC)    Depression    History of anal fissures  Polysubstance abuse (HCC)    cocaine/ marjiuana   Substance abuse (HCC)     Past Surgical History:  Procedure Laterality Date   CESAREAN SECTION  x4  last one 06-19-2017   FRACTURE SURGERY     OPEN REDUCTION INTERNAL FIXATION (ORIF) DISTAL PHALANX Right 09/02/2019   Procedure: OPEN REDUCTION INTERNAL FIXATION (ORIF) right long finger malunion repair;  Surgeon: Arvil Birks, MD;  Location: [facility];  Service: Orthopedics;  Laterality: Right;   RECTAL EXAM UNDER ANESTHESIA N/A 02/17/2020   Procedure: ANAL EXAM UNDER ANESTHESIA;  Surgeon: Joyce Nixon, MD;  Location: [facility];  Service: General;  Laterality: N/A;   SPHINCTEROTOMY N/A 02/17/2020   Procedure: CHEMICAL SPHINCTEROTOMY BOTOX , HEMORRHOIDPEXY;  Surgeon: Joyce Nixon, MD;  Location: [facility];   Service: General;  Laterality: N/A;   SPHINCTEROTOMY N/A 05/25/2020   Procedure: LATERAL INTERNAL SPHINCTEROTOMY;  Surgeon: Joyce Nixon, MD;  Location: [facility];  Service: General;  Laterality: N/A;    No current outpatient medications on file prior to visit.   No current facility-administered medications on file prior to visit.    Allergies  Allergen Reactions   Shellfish Allergy Hives    Lips tingle, facial swelling     Social History   Socioeconomic History   Marital status: Single    Spouse name: Not on file   Number of children: Not on file   Years of education: Not on file   Highest education level: GED or equivalent  Occupational History   Not on file  Tobacco Use   Smoking status: Every Day    Current packs/day: 0.50    Average packs/day: 0.5 packs/day for 12.0 years (6.0 ttl pk-yrs)    Types: Cigarettes   Smokeless tobacco: Never  Vaping Use   Vaping status: Never Used  Substance and Sexual Activity   Alcohol use: Yes    Comment: pint some days not every day, social   Drug use: Yes    Types: "Crack" cocaine, Marijuana    Comment: 05-24-2020 per pt cocaine last use 02-09-2020, marijuana use every day   Sexual activity: Not on file  Other Topics Concern   Not on file  Social History Narrative   Not on file   Social Drivers of Health   Financial Resource Strain: High Risk (10/31/2023)   Overall Financial Resource Strain (CARDIA)    Difficulty of Paying Living Expenses: Hard  Food Insecurity: Food Insecurity Present (10/31/2023)   Hunger Vital Sign    Worried About Running Out of Food in the Last Year: Sometimes true    Ran Out of Food in the Last Year: Sometimes true  Transportation Needs: Unmet Transportation Needs (10/31/2023)   PRAPARE - Transportation    Lack of Transportation (Medical): Yes    Lack of Transportation (Non-Medical): Yes  Physical Activity: Insufficiently Active (10/31/2023)   Exercise Vital Sign    Days of Exercise  per Week: 2 days    Minutes of Exercise per Session: 30 min  Stress: Stress Concern Present (10/31/2023)   [employer] of Occupational Health - Occupational Stress Questionnaire    Feeling of Stress : Very much  Social Connections: Moderately Isolated (10/31/2023)   Social Connection and Isolation Panel [NHANES]    Frequency of Communication with Friends and Family: Twice a week    Frequency of Social Gatherings with Friends and Family: Once a week    Attends Religious Services: More than 4 times per year  Active Member of Clubs or Organizations: No    Attends Banker Meetings: Not on file    Marital Status: Never married  Intimate Partner Violence: Not At Risk (11/27/2023)   Humiliation, Afraid, Rape, and Kick questionnaire    Fear of Current or Ex-Partner: No    Emotionally Abused: No    Physically Abused: No    Sexually Abused: No   Social History   Tobacco Use  Smoking Status Every Day   Current packs/day: 0.50   Average packs/day: 0.5 packs/day for 12.0 years (6.0 ttl pk-yrs)   Types: Cigarettes  Smokeless Tobacco Never   Social History   Substance and Sexual Activity  Alcohol Use Yes   Comment: pint some days not every day, social    Family History  Problem Relation Age of Onset   Asthma Mother    Anxiety disorder Mother    Depression Mother    Heart disease Mother    Asthma Father      ROS: Denies fever, fatigue, unexplained weight loss/gain, chest pain, SHOB, and palpitations. Denies neurological deficits, gastrointestinal or genitourinary complaints, and skin changes.   Objective:   Today's Vitals   03/02/24 0945 03/02/24 1033  BP: (!) 132/99 (!) 130/97  Pulse: 63   SpO2: 100%   Weight: 56.2 kg   Height: 5\' 4"  (1.626 m)     GENERAL APPEARANCE: Well-appearing, in NAD. Well nourished.  SKIN: Pink, warm and dry. Turgor normal. No rash, lesion, ulceration, or ecchymoses. Hair evenly distributed.  HEENT: HEAD: Normocephalic.  EYES:  PERRLA. EOMI. Lids intact w/o defect. Sclera white, Conjunctiva pink w/o exudate.  EARS: External ear w/o redness, swelling, masses or lesions. EAC clear. TM's intact, translucent w/o bulging, appropriate landmarks visualized. Appropriate acuity to conversational tones.  NOSE: Septum midline w/o deformity. Nares patent, mucosa pink and non-inflamed w/o drainage. No sinus tenderness.  THROAT: Uvula midline. Oropharynx clear. Tonsils non-inflamed w/o exudate. Oral mucosa pink and moist.  NECK: Supple, Trachea midline. Full ROM w/o pain or tenderness. No lymphadenopathy. Thyroid non-tender w/o enlargement or palpable masses.  RESPIRATORY: Chest wall symmetrical w/o masses. Respirations even and non-labored. Breath sounds clear to auscultation bilaterally. No wheezes, rales, rhonchi, or crackles. CARDIAC: S1, S2 present, regular rate and rhythm. No gallops, murmurs, rubs, or clicks. PMI w/o lifts, heaves, or thrills. No carotid bruits. Capillary refill <2 seconds. Peripheral pulses 2+ bilaterally. GI: Abdomen soft w/o distention. Normoactive bowel sounds. No palpable masses or tenderness. No guarding or rebound tenderness. Liver and spleen w/o tenderness or enlargement. No CVA tenderness.  GU: Pt declined exam.  MSK: Muscle tone and strength appropriate for age, w/o atrophy or abnormal movement.  EXTREMITIES: Active ROM intact, w/o tenderness, crepitus, or contracture. No obvious joint deformities or effusions. No clubbing, edema, or cyanosis.  NEUROLOGIC: CN's II-XII intact. Motor strength symmetrical with no obvious weakness. No sensory deficits. DTR's 2+ symmetric bilaterally. Steady, even gait.  PSYCH/MENTAL STATUS: Alert, oriented x 3. Cooperative, appropriate mood and affect.   Results for orders placed or performed in visit on 11/27/23  Cervicovaginal ancillary only   Collection Time: 11/27/23  8:37 AM  Result Value Ref Range   Neisseria Gonorrhea Positive (A)    Chlamydia Negative     Trichomonas Negative    Bacterial Vaginitis (gardnerella) Negative    Candida Vaginitis Negative    Candida Glabrata Negative    Comment      Normal Reference Range Bacterial Vaginosis - Negative   Comment Normal Reference Range Candida  Species - Negative    Comment Normal Reference Range Candida Galbrata - Negative    Comment Normal Reference Range Trichomonas - Negative    Comment Normal Reference Ranger Chlamydia - Negative    Comment      Normal Reference Range Neisseria Gonorrhea - Negative  Cytology - PAP   Collection Time: 11/27/23  8:41 AM  Result Value Ref Range   High risk HPV Negative    Adequacy      Satisfactory for evaluation; transformation zone component PRESENT.   Diagnosis      - Negative for Intraepithelial Lesions or Malignancy (NILM)   Diagnosis - Benign reactive/reparative changes    Comment Normal Reference Range HPV - Negative     Assessment & Plan:  1. Annual physical exam (Primary) Reviewed preventative screenings, vaccinations, and will obtain fastin glab work today. Discussed healthy lifestyle changes, discussed HPV vaccines.  - CBC with Differential/Platelet - Comprehensive metabolic panel with GFR - Lipid panel - TSH  2. Encounter for assessment of sexually transmitted disease exposure Asymptomatic, pt requested testing.  - RPR - Cervicovaginal ancillary only  3. Encounter for screening for HIV Asymptomatic, pt requested testing. - HIV Antibody (routine testing w rflx)  4. Encounter for hepatitis C screening test for low risk patient Asymptomatic, pt requested testing. - Hepatitis C antibody  5. Essential hypertension Pt has had three elevated BP reading within the last two visits. Discussed healthy lifestyle changes including diet and exercise vs starting a low dose blood pressure medication. Pt elected to work on diet and lifestyle changes. Plan is for pt RTO in 40mo for BP recheck.   6. Insomnia Discussed sleep hygiene with pt,  including avoiding bright screens and/or blue light from cell phone before bed, reducing alcohol intake as that can interfere with sleep quality. Suggested OTC Sleep-3 sleep aid to aid with trouble sleeping.    Orders Placed This Encounter  Procedures   CBC with Differential/Platelet   Comprehensive metabolic panel with GFR   Lipid panel   TSH   RPR   Hepatitis C antibody   HIV Antibody (routine testing w rflx)    PATIENT COUNSELING:  - Encouraged a healthy well-balanced diet. Patient may adjust caloric intake to maintain or achieve ideal body weight. May reduce intake of dietary saturated fat and total fat and have adequate dietary potassium and calcium preferably from fresh fruits, vegetables, and low-fat dairy products.   - Advised to avoid cigarette smoking. - Discussed with the patient that most people either abstain from alcohol or drink within safe limits (<=14/week and <=4 drinks/occasion for males, <=7/weeks and <= 3 drinks/occasion for females) and that the risk for alcohol disorders and other health effects rises proportionally with the number of drinks per week and how often a drinker exceeds daily limits. - Discussed cessation/primary prevention of drug use and availability of treatment for abuse.  - Discussed sexually transmitted diseases, avoidance of unintended pregnancy and contraceptive alternatives.  - Stressed the importance of regular exercise - Injury prevention: Discussed safety belts, safety helmets, smoke detector, smoking near bedding or upholstery.  - Dental health: Discussed importance of regular tooth brushing, flossing, and dental visits.   NEXT PREVENTATIVE PHYSICAL DUE IN 1 YEAR.  Return for 3 months: BP Check only; 1 year AE (fasting labs at visit) .  Patient to reach out to office if new, worrisome, or unresolved symptoms arise or if no improvement in patient's condition. Patient verbalized understanding and is agreeable to treatment plan. All  questions  answered to patient's satisfaction.   Treatment plan and recommendation(s) reviewed by supervising preceptor, Annell Barrow, FNP-C, prior to clinic discharge.   Otila Blizzard, RN  DNP Student

## 2024-03-03 ENCOUNTER — Ambulatory Visit (HOSPITAL_BASED_OUTPATIENT_CLINIC_OR_DEPARTMENT_OTHER): Payer: Self-pay | Admitting: Family Medicine

## 2024-03-03 DIAGNOSIS — R7989 Other specified abnormal findings of blood chemistry: Secondary | ICD-10-CM

## 2024-03-03 LAB — CBC WITH DIFFERENTIAL/PLATELET
Basophils Absolute: 0 10*3/uL (ref 0.0–0.2)
Basos: 1 %
EOS (ABSOLUTE): 0.2 10*3/uL (ref 0.0–0.4)
Eos: 3 %
Hematocrit: 38.9 % (ref 34.0–46.6)
Hemoglobin: 12 g/dL (ref 11.1–15.9)
Immature Grans (Abs): 0 10*3/uL (ref 0.0–0.1)
Immature Granulocytes: 0 %
Lymphocytes Absolute: 1.5 10*3/uL (ref 0.7–3.1)
Lymphs: 27 %
MCH: 28.8 pg (ref 26.6–33.0)
MCHC: 30.8 g/dL — ABNORMAL LOW (ref 31.5–35.7)
MCV: 93 fL (ref 79–97)
Monocytes Absolute: 0.4 10*3/uL (ref 0.1–0.9)
Monocytes: 8 %
Neutrophils Absolute: 3.4 10*3/uL (ref 1.4–7.0)
Neutrophils: 61 %
Platelets: 196 10*3/uL (ref 150–450)
RBC: 4.17 x10E6/uL (ref 3.77–5.28)
RDW: 12.5 % (ref 11.7–15.4)
WBC: 5.6 10*3/uL (ref 3.4–10.8)

## 2024-03-03 LAB — COMPREHENSIVE METABOLIC PANEL WITH GFR
ALT: 77 IU/L — ABNORMAL HIGH (ref 0–32)
AST: 86 IU/L — ABNORMAL HIGH (ref 0–40)
Albumin: 4.5 g/dL (ref 3.9–4.9)
Alkaline Phosphatase: 83 IU/L (ref 44–121)
BUN/Creatinine Ratio: 15 (ref 9–23)
BUN: 12 mg/dL (ref 6–20)
Bilirubin Total: 0.4 mg/dL (ref 0.0–1.2)
CO2: 21 mmol/L (ref 20–29)
Calcium: 9.5 mg/dL (ref 8.7–10.2)
Chloride: 103 mmol/L (ref 96–106)
Creatinine, Ser: 0.82 mg/dL (ref 0.57–1.00)
Globulin, Total: 2.4 g/dL (ref 1.5–4.5)
Glucose: 83 mg/dL (ref 70–99)
Potassium: 4.7 mmol/L (ref 3.5–5.2)
Sodium: 139 mmol/L (ref 134–144)
Total Protein: 6.9 g/dL (ref 6.0–8.5)
eGFR: 94 mL/min/{1.73_m2} (ref >59)

## 2024-03-03 LAB — LIPID PANEL
Chol/HDL Ratio: 1.8 ratio (ref 0.0–4.4)
Cholesterol, Total: 204 mg/dL — ABNORMAL HIGH (ref 100–199)
HDL: 113 mg/dL (ref >39)
LDL Chol Calc (NIH): 80 mg/dL (ref 0–99)
Triglycerides: 63 mg/dL (ref 0–149)
VLDL Cholesterol Cal: 11 mg/dL (ref 5–40)

## 2024-03-03 LAB — RPR: RPR Ser Ql: NONREACTIVE

## 2024-03-03 LAB — HIV ANTIBODY (ROUTINE TESTING W REFLEX): HIV Screen 4th Generation wRfx: NONREACTIVE

## 2024-03-03 LAB — HEPATITIS C ANTIBODY: Hep C Virus Ab: NONREACTIVE

## 2024-03-03 LAB — TSH: TSH: 0.978 u[IU]/mL (ref 0.450–4.500)

## 2024-03-04 ENCOUNTER — Encounter (HOSPITAL_BASED_OUTPATIENT_CLINIC_OR_DEPARTMENT_OTHER): Payer: Self-pay | Admitting: Family Medicine

## 2024-03-04 DIAGNOSIS — R7989 Other specified abnormal findings of blood chemistry: Secondary | ICD-10-CM | POA: Insufficient documentation

## 2024-03-04 LAB — CERVICOVAGINAL ANCILLARY ONLY
Bacterial Vaginitis (gardnerella): POSITIVE — AB
Candida Glabrata: NEGATIVE
Candida Vaginitis: POSITIVE — AB
Chlamydia: NEGATIVE
Comment: NEGATIVE
Comment: NEGATIVE
Comment: NEGATIVE
Comment: NEGATIVE
Comment: NEGATIVE
Comment: NORMAL
Neisseria Gonorrhea: NEGATIVE
Trichomonas: NEGATIVE

## 2024-03-04 MED ORDER — METRONIDAZOLE 500 MG PO TABS: 500.0000 mg | ORAL_TABLET | Freq: Two times a day (BID) | ORAL | 0 refills | Status: DC

## 2024-03-04 MED ORDER — FLUCONAZOLE 150 MG PO TABS: 150.0000 mg | ORAL_TABLET | Freq: Once | ORAL | 0 refills | Status: DC

## 2024-03-04 NOTE — Progress Notes (Signed)
 Please schedule for lab only visit in 4 to 6 months.  Your vaginal swab was positive for BV (bacterial vaginosis). I am sending in an antibiotic for you (metronidazole ); DO NOT use alcohol 24 hours before, or 72 hours after taking these antibiotics. This is not a sexually transmitted disease- no need to treat partners, however condoms may reduce the risk of recurrent BV. Additional recommendations: avoid douching, consider using sanitary napkins instead of tampons, choose all-cotton underwear, and avoid wearing tight/constrictive clothing.   Additionally, your swab was positive for yeast.  After you finish your metronidazole  (Flagyl ), please take 1 tablet of Diflucan  (fluconazole ).  If you are still having vaginal itching or discharge 3 days after this dosage, you may repeat it with 1 more tablet.   your cholesterol is slightly elevated.  Your blood counts are stable.  Your liver enzymes are significantly elevated from the last time they were checked and likely due to diet and alcohol intake.  Please reduce your alcohol intake and dietary changes with a heart healthy diet to reduce the risk of progression to liver disease.  I would recommend we repeat these in 4 to 6 months.  Your thyroid and STD checks were normal.

## 2024-03-05 MED ORDER — METRONIDAZOLE 500 MG PO TABS: 500.0000 mg | ORAL_TABLET | Freq: Two times a day (BID) | ORAL | 0 refills | Status: DC

## 2024-03-05 MED ORDER — FLUCONAZOLE 150 MG PO TABS: 150.0000 mg | ORAL_TABLET | Freq: Once | ORAL | 0 refills | Status: DC

## 2024-04-22 ENCOUNTER — Other Ambulatory Visit (HOSPITAL_BASED_OUTPATIENT_CLINIC_OR_DEPARTMENT_OTHER): Payer: Self-pay | Admitting: Family Medicine

## 2024-04-22 MED ORDER — FLUCONAZOLE 150 MG PO TABS: 150.0000 mg | ORAL_TABLET | Freq: Once | ORAL | 0 refills | Status: AC

## 2024-04-22 MED ORDER — METRONIDAZOLE 500 MG PO TABS: 500.0000 mg | ORAL_TABLET | Freq: Two times a day (BID) | ORAL | 0 refills | Status: AC

## 2024-04-27 ENCOUNTER — Ambulatory Visit (HOSPITAL_BASED_OUTPATIENT_CLINIC_OR_DEPARTMENT_OTHER): Payer: MEDICAID | Admitting: Family Medicine

## 2024-05-01 ENCOUNTER — Ambulatory Visit (INDEPENDENT_AMBULATORY_CARE_PROVIDER_SITE_OTHER): Payer: MEDICAID | Admitting: Family Medicine

## 2024-05-01 ENCOUNTER — Encounter (HOSPITAL_BASED_OUTPATIENT_CLINIC_OR_DEPARTMENT_OTHER): Payer: Self-pay | Admitting: Family Medicine

## 2024-05-01 ENCOUNTER — Other Ambulatory Visit (HOSPITAL_COMMUNITY)
Admission: RE | Admit: 2024-05-01 | Discharge: 2024-05-01 | Disposition: A | Discharge status: Home / Self Care | Payer: MEDICAID | Source: Ambulatory Visit | Attending: Family Medicine | Admitting: Family Medicine

## 2024-05-01 VITALS — BP 119/90 | HR 61 | Ht 64.0 in | Wt 117.8 lb

## 2024-05-01 DIAGNOSIS — N898 Other specified noninflammatory disorders of vagina: Secondary | ICD-10-CM | POA: Insufficient documentation

## 2024-05-01 DIAGNOSIS — N926 Irregular menstruation, unspecified: Secondary | ICD-10-CM | POA: Diagnosis not present

## 2024-05-01 DIAGNOSIS — F4320 Adjustment disorder, unspecified: Secondary | ICD-10-CM | POA: Diagnosis not present

## 2024-05-01 NOTE — Patient Instructions (Signed)
 Counseling and Mental Health Resources   [facility]  - For Women and Girls only - Cost based upon sliding scale of income - Financial Aid available  [phone] [address], [address] [place], [place] [address] [facility]  - Mental Health, Substance Abuse Treatment - IV Ketamine, Hydration and Weight Loss Programs - Center for Treatment for Resistant Depression and Suicidal Ideation  [address] [place], [place] [address]  [phone] [email or web address]   [facility]  - Individual and Family Counseling - Assessments and Therapy for Learning Disabilities, ADHD, Autism Spectrum Disorder, Processing Deficits  [phone] [address] [place], [place] [address]  [facility]  [address] [place] [address] [place], [place] [address]  [phone]  [facility]  - Individual, Family, Play and Group Therapy - In person and telehealth sessions available  Phone: [phone] Email: [email or web address]  [place] Location:   [address] [place] [address] [place], [place] [address]   [facility] Location:   [address] [place], [place] [address] Su Ley MA Clinical Psychology  [address] [place] [address]  [phone]  [facility], [facility]  Adult, Adolescent and [facility]  [address], [place], [place] [address]  Text:  [phone]   Call:  [phone] Email: [email or web address] [facility] - [facility]    [facility]  - Individual, Group Therapy - Day Programs, Wellness Coaching - Staff Programming, Workshops  [address], [place], [place] [address]  [phone]  [facility], [facility]  - Children, Adolescent, Adult and Family  Therapy  [place] Location [phone]  [address] [place], [place] [address]   [place] Location [phone]  [address] [place], [place] [address]   [facility]  - Indivudual Counseling, [facility] [address] [place], [place] [address]  [phone] [facility]  -Services at "less than a basic fee" sponsored by [facility]  [address] [place], [place] [address]  [phone]   [facility]  Counseling offered by Psychology Doctoral Students  - Majority of Patients qualify for financial assistance   [address] [place], [place] [address]  [phone]

## 2024-05-01 NOTE — Progress Notes (Signed)
 Acute Care Office Visit  Subjective:   Mandy Wilson [birth year] 05/01/2024  Chief Complaint  Patient presents with   Medical Management of Chronic Issues    Pt has concerns today about her mental health after recently losing her parents and also problems with her child. Also states after having intercourse, she will begin to have vaginal itching that she wants to discuss.    HPI: Discussed the use of AI scribe software for clinical note transcription with the patient, who gave verbal consent to proceed.  History of Present Illness Mandy Wilson is a 38 year old female who presents with vaginal itching after exposure to semen.  VAGINAL ITCHING:  She experiences persistent vaginal itching whenever semen is present, describing it as an itch that cannot be relieved even after showering or bathing. This has been ongoing for years without associated burning, lesion formation, or rash.  She was previously treated for gonorrhea in March and has been taking the prescribed medication.  She has not had a menstrual period in recent months and is unsure if she is pregnant. She is open to a pregnancy test. Patient's last menstrual period was 03/24/2024 (approximate).    GRIEF REACTION:  She recently lost both of her parents and is seeking counseling to help with her grief. Additionally, her child's father was recently arrested, adding to her stress. She is interested in finding affordable counseling services.She is not currently interested in medication. Reports good support system with current partner.      05/01/2024   10:47 AM 03/02/2024    9:53 AM 12/31/2023    1:59 PM 11/27/2023    8:10 AM  Depression screen PHQ 2/9  Decreased Interest 2 2 0 1  Down, Depressed, Hopeless 2 3 0 1  PHQ - 2 Score 4 5 0 2  Altered sleeping 3 3  3   Tired, decreased energy 2 3  2   Change in appetite 1 0  0  Feeling bad or failure about yourself  3 2  0  Trouble concentrating 2 2  2   Moving slowly or  fidgety/restless 0 0  0  Suicidal thoughts 0 0  0  PHQ-9 Score 15 15  9   Difficult doing work/chores Somewhat difficult Very difficult  Somewhat difficult      05/01/2024   10:48 AM 03/02/2024    9:54 AM 11/27/2023    8:13 AM  GAD 7 : Generalized Anxiety Score  Nervous, Anxious, on Edge 3 3 1   Control/stop worrying 3 3 2   Worry too much - different things 2 3 3   Trouble relaxing 3 3 3   Restless 0 0 0  Easily annoyed or irritable 3 3 3   Afraid - awful might happen 1 0 0  Total GAD 7 Score 15 15 12   Anxiety Difficulty Not difficult at all Not difficult at all Not difficult at all       The following portions of the patient's history were reviewed and updated as appropriate: past medical history, past surgical history, family history, social history, allergies, medications, and problem list.   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  Past Medical History:  Diagnosis Date   Anal fissure    recurrent   Anemia    Anxiety    Bipolar 1 disorder (HCC)    Depression    History of anal fissures    Polysubstance abuse (HCC)    cocaine/ marjiuana   Substance abuse (HCC)    Past Surgical History:  Procedure Laterality Date   CESAREAN SECTION  x4  last one 06-19-2017   FRACTURE SURGERY     OPEN REDUCTION INTERNAL FIXATION (ORIF) DISTAL PHALANX Right 09/02/2019   Procedure: OPEN REDUCTION INTERNAL FIXATION (ORIF) right long finger malunion repair;  Surgeon: Shari Easter, MD;  Location: [facility];  Service: Orthopedics;  Laterality: Right;   RECTAL EXAM UNDER ANESTHESIA N/A 02/17/2020    Procedure: ANAL EXAM UNDER ANESTHESIA;  Surgeon: Debby Hila, MD;  Location: [facility];  Service: General;  Laterality: N/A;   SPHINCTEROTOMY N/A 02/17/2020   Procedure: CHEMICAL SPHINCTEROTOMY BOTOX , HEMORRHOIDPEXY;  Surgeon: Debby Hila, MD;  Location: [facility];  Service: General;  Laterality: N/A;   SPHINCTEROTOMY N/A 05/25/2020   Procedure: LATERAL INTERNAL SPHINCTEROTOMY;  Surgeon: Debby Hila, MD;  Location: [facility];  Service: General;  Laterality: N/A;   Family History  Problem Relation Age of Onset   Early death Mother 58   Asthma Mother    Anxiety disorder Mother    Depression Mother    Heart disease Mother    Early death Father 62   Asthma Father    No outpatient medications prior to visit.   No facility-administered medications prior to visit.   Allergies  Allergen Reactions   Shellfish Allergy Hives    Lips tingle, facial swelling     ROS: A complete ROS was performed with pertinent positives/negatives noted in the HPI. The remainder of the ROS are negative.    Objective:   Today's Vitals   05/01/24 1033 05/01/24 1143  BP: (!) 133/96 (!) 119/90  Pulse: 78 61  SpO2: 100%   Weight: 117 lb 12.8 oz (53.4 kg)   Height: 5' 4 (1.626 m)     GENERAL: Well-appearing, in NAD. Well nourished.  SKIN: Pink, warm and dry.  Head: Normocephalic. NECK: Trachea midline. Full ROM w/o pain or tenderness.  RESPIRATORY: Chest wall symmetrical. Respirations even and non-labored. Breath sounds clear to auscultation bilaterally.  CARDIAC: S1, S2 present, regular rate and rhythm without murmur or gallops. Peripheral pulses 2+ bilaterally.  GU: External genitalia without erythema, lesions, or masses. No lymphadenopathy. Vaginal mucosa pink and moist without exudate, lesions, or ulcerations. Cervix pink with mild white discharge. Cervical os closed. Uterus and adnexae palpable, not enlarged, and w/o tenderness. No  palpable masses. Chaperoned by Damien Many, CMA.  EXTREMITIES: Without clubbing, cyanosis, or edema.  NEUROLOGIC: No motor or sensory deficits. Steady, even gait. C2-C12 intact.  PSYCH/MENTAL STATUS: Alert, oriented x 3. Cooperative, tearful mood and affect.    Results for orders placed or performed in visit on 05/01/24  POCT urine pregnancy  Result Value Ref Range   Preg Test, Ur Negative Negative      Assessment & Plan:  1. Vaginal itching (Primary) Will check urine for STI clearance and infection and vaginal swab for BV, Yeast contributing to vaginal itching. Recommend avoiding lubricants, condoms that may have latex, silicone that may be irritating to vaginal mucosa and avoiding soaps, fragrances, bubble baths that may irritate. Urine pregnancy test is negative.  - Urine cytology ancillary only - Cervicovaginal ancillary  only  2. Grief reaction Discussed benefits of counseling with grief and will place referral to counseling within [place]. Local counselors also provided. Safety plan reviewed.  - Ambulatory referral to Psychology   Return if symptoms worsen or fail to improve.    Patient to reach out to office if new, worrisome, or unresolved symptoms arise or if no improvement in patient's condition. Patient verbalized understanding and is agreeable to treatment plan. All questions answered to patient's satisfaction.    Thersia Schuyler Stark, [place]

## 2024-05-04 LAB — CERVICOVAGINAL ANCILLARY ONLY
Bacterial Vaginitis (gardnerella): NEGATIVE
Candida Glabrata: POSITIVE — AB
Candida Vaginitis: POSITIVE — AB
Comment: NEGATIVE
Comment: NEGATIVE
Comment: NEGATIVE

## 2024-05-04 LAB — URINE CYTOLOGY ANCILLARY ONLY
Chlamydia: POSITIVE — AB
Comment: NEGATIVE
Comment: NEGATIVE
Comment: NORMAL
Neisseria Gonorrhea: NEGATIVE
Trichomonas: NEGATIVE

## 2024-05-04 LAB — POCT URINE PREGNANCY: Preg Test, Ur: NEGATIVE

## 2024-05-05 ENCOUNTER — Ambulatory Visit (HOSPITAL_BASED_OUTPATIENT_CLINIC_OR_DEPARTMENT_OTHER): Payer: Self-pay | Admitting: Family Medicine

## 2024-05-05 DIAGNOSIS — A749 Chlamydial infection, unspecified: Secondary | ICD-10-CM | POA: Insufficient documentation

## 2024-05-05 DIAGNOSIS — B3731 Acute candidiasis of vulva and vagina: Secondary | ICD-10-CM | POA: Insufficient documentation

## 2024-05-05 MED ORDER — FLUCONAZOLE 150 MG PO TABS: 150.0000 mg | ORAL_TABLET | Freq: Once | ORAL | 0 refills | Status: DC

## 2024-05-05 MED ORDER — DOXYCYCLINE HYCLATE 100 MG PO TABS: 100.0000 mg | ORAL_TABLET | Freq: Two times a day (BID) | ORAL | 0 refills | Status: DC

## 2024-05-05 NOTE — Progress Notes (Signed)
 Please schedule a 4-6 week follow up with urine cytology .   Your test results show you have tested positive for Chlamydia. This is a sexually transmitted disease. You will need to notify all sexual partners and they will need to receive treatment.  Complete full course of antibiotics, even if symptoms resolve prior to completing. Abstain from sex while on antibiotics and until all symptoms resolve. Follow up with primary care provider in 1 months for re-testing, or sooner if symptoms persist or fail to improve.   Your vaginal swab was also positive for yeast. I will send in Diflucan for you to take. Please take this AFTER you have finished the antibiotic DOXYCYCLINE for chlamydial infection.

## 2024-05-05 NOTE — Progress Notes (Signed)
 Please schedule a 4-6 week follow up with urine cytology .   Your test results show you have tested positive for Chlamydia. This is a sexually transmitted disease. You will need to notify all sexual partners and they will need to receive treatment.  Complete full course of antibiotics, even if symptoms resolve prior to completing. Abstain from sex while on antibiotics and until all symptoms resolve. Follow up with primary care provider in 1 months for re-testing, or sooner if symptoms persist or fail to improve.   Your vaginal swab was also positive for yeast. I will send in Diflucan  for you to take. Please take this AFTER you have finished the antibiotic DOXYCYCLINE  for chlamydial infection.

## 2024-05-11 ENCOUNTER — Other Ambulatory Visit (HOSPITAL_BASED_OUTPATIENT_CLINIC_OR_DEPARTMENT_OTHER): Payer: Self-pay | Admitting: Family Medicine

## 2024-05-11 DIAGNOSIS — A749 Chlamydial infection, unspecified: Secondary | ICD-10-CM

## 2024-05-11 DIAGNOSIS — B3731 Acute candidiasis of vulva and vagina: Secondary | ICD-10-CM

## 2024-05-11 MED ORDER — FLUCONAZOLE 150 MG PO TABS: 150.0000 mg | ORAL_TABLET | Freq: Once | ORAL | 0 refills | Status: AC

## 2024-05-11 MED ORDER — DOXYCYCLINE HYCLATE 100 MG PO TABS: 100.0000 mg | ORAL_TABLET | Freq: Two times a day (BID) | ORAL | 0 refills | Status: DC

## 2024-05-20 ENCOUNTER — Ambulatory Visit (HOSPITAL_BASED_OUTPATIENT_CLINIC_OR_DEPARTMENT_OTHER): Payer: MEDICAID | Admitting: Family Medicine

## 2024-05-20 ENCOUNTER — Other Ambulatory Visit (HOSPITAL_BASED_OUTPATIENT_CLINIC_OR_DEPARTMENT_OTHER): Payer: Self-pay | Admitting: Family Medicine

## 2024-05-20 DIAGNOSIS — A749 Chlamydial infection, unspecified: Secondary | ICD-10-CM

## 2024-05-20 MED ORDER — DOXYCYCLINE HYCLATE 100 MG PO TABS: 100.0000 mg | ORAL_TABLET | Freq: Two times a day (BID) | ORAL | 0 refills | Status: AC

## 2024-05-20 NOTE — Telephone Encounter (Signed)
 Thersia, please see recent mychart message sent by pt about doxycycline  and advise if you are okay with us  sending another Rx in for pt.

## 2024-07-30 ENCOUNTER — Encounter (HOSPITAL_BASED_OUTPATIENT_CLINIC_OR_DEPARTMENT_OTHER): Payer: Self-pay | Admitting: Family Medicine

## 2024-07-30 ENCOUNTER — Other Ambulatory Visit (HOSPITAL_COMMUNITY)
Admission: RE | Admit: 2024-07-30 | Discharge: 2024-07-30 | Disposition: A | Payer: MEDICAID | Source: Ambulatory Visit | Attending: Family Medicine | Admitting: Family Medicine

## 2024-07-30 ENCOUNTER — Ambulatory Visit (INDEPENDENT_AMBULATORY_CARE_PROVIDER_SITE_OTHER): Payer: MEDICAID | Admitting: Family Medicine

## 2024-07-30 VITALS — BP 129/101 | HR 55 | Ht 64.0 in | Wt 117.0 lb

## 2024-07-30 DIAGNOSIS — Z114 Encounter for screening for human immunodeficiency virus [HIV]: Secondary | ICD-10-CM | POA: Diagnosis not present

## 2024-07-30 DIAGNOSIS — Z202 Contact with and (suspected) exposure to infections with a predominantly sexual mode of transmission: Secondary | ICD-10-CM | POA: Insufficient documentation

## 2024-07-30 DIAGNOSIS — B379 Candidiasis, unspecified: Secondary | ICD-10-CM

## 2024-07-30 DIAGNOSIS — R102 Pelvic and perineal pain unspecified side: Secondary | ICD-10-CM

## 2024-07-30 DIAGNOSIS — Z1159 Encounter for screening for other viral diseases: Secondary | ICD-10-CM | POA: Diagnosis not present

## 2024-07-30 LAB — POCT URINALYSIS DIP (CLINITEK)
Bilirubin, UA: NEGATIVE
Blood, UA: NEGATIVE
Glucose, UA: NEGATIVE mg/dL
Ketones, POC UA: NEGATIVE mg/dL
Leukocytes, UA: NEGATIVE
Nitrite, UA: NEGATIVE
POC PROTEIN,UA: NEGATIVE
Spec Grav, UA: 1.025 (ref 1.010–1.025)
Urobilinogen, UA: 0.2 U/dL
pH, UA: 7 (ref 5.0–8.0)

## 2024-07-30 MED ORDER — FLUCONAZOLE 150 MG PO TABS: 150.0000 mg | ORAL_TABLET | Freq: Once | ORAL | 0 refills | Status: AC

## 2024-07-30 NOTE — Progress Notes (Signed)
 Acute Care Office Visit  Subjective:   Mandy Wilson 15-Aug-1986 07/30/2024  Chief Complaint  Patient presents with   Vaginitis    Pt states that she continues to get yeast infections. States that she went to a party and had been drinking and states when she woke up, she was not wearing any underwear so she does want to have testing performed.    HPI: Discussed the use of AI scribe software for clinical note transcription with the patient, who gave verbal consent to proceed.  History of Present Illness Mandy Wilson is a 38 year old female who presents with recurrent yeast infections and requests STI testing.  She has a history of a yeast infection in August which resolved after treatment. Recently, she experienced another episode of vaginal itching, described as 'unbearable sometimes'. She used a tablet leftover of fluconazole  (Diflucan ) for vaginal itching, but reports that the itching has persisted.  She recounts an incident at a party where she consumed alcohol, stayed overnight, and woke up without her underwear, leading to concerns about potential sexual activity she does not recall. Following this event, she experienced vaginal itching and is seeking comprehensive STI testing, including for hepatitis C, HIV, and syphilis.  She reports intermittent abdominal pain, sometimes severe and likened to 'contractions'. This pain is exacerbated during bowel movements and is also present during her menstrual periods. No vaginal bleeding is noted, and she is not concerned about pregnancy, with her last menstrual period occurring the week before.  Patient's last menstrual period was 07/13/2024 (approximate).   The following portions of the patient's history were reviewed and updated as appropriate: past medical history, past surgical history, family history, social history, allergies, medications, and problem list.   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   Past Medical History:  Diagnosis Date   Anal fissure    recurrent   Anemia    Anxiety    Bipolar 1 disorder (HCC)    Depression    History of anal fissures    Polysubstance abuse (HCC)    cocaine/ marjiuana   Substance abuse (HCC)    Past Surgical History:  Procedure Laterality Date   CESAREAN SECTION  x4  last one 06-19-2017   FRACTURE SURGERY     OPEN REDUCTION INTERNAL FIXATION (ORIF) DISTAL PHALANX Right 09/02/2019   Procedure: OPEN REDUCTION INTERNAL FIXATION (ORIF) right long finger malunion repair;  Surgeon: Shari Easter, MD;  Location: [facility];  Service: Orthopedics;  Laterality: Right;   RECTAL EXAM UNDER ANESTHESIA N/A 02/17/2020   Procedure: ANAL EXAM UNDER ANESTHESIA;  Surgeon: Debby Hila, MD;  Location: [facility];  Service: General;  Laterality: N/A;   SPHINCTEROTOMY N/A 02/17/2020   Procedure: CHEMICAL SPHINCTEROTOMY BOTOX , HEMORRHOIDPEXY;  Surgeon: Debby Hila, MD;  Location: [facility];  Service: General;  Laterality: N/A;   SPHINCTEROTOMY N/A 05/25/2020   Procedure: LATERAL INTERNAL SPHINCTEROTOMY;  Surgeon: Debby Hila, MD;  Location: [facility] [place];  Service: General;  Laterality: N/A;   Family History  Problem Relation Age  of Onset   Early death Mother 58   Asthma Mother    Anxiety disorder Mother    Depression Mother    Heart disease Mother    Early death Father 63   Asthma Father    Outpatient Medications Prior to Visit  Medication Sig Dispense Refill    doxycycline  (VIBRA -TABS) 100 MG tablet Take 1 tablet (100 mg total) by mouth 2 (two) times daily. 14 tablet 0   No facility-administered medications prior to visit.   Allergies  Allergen Reactions   Shellfish Allergy Hives    Lips tingle, facial swelling     ROS: A complete ROS was performed with pertinent positives/negatives noted in the HPI. The remainder of the ROS are negative.    Objective:   Today's Vitals   07/30/24 0916  BP: (!) 129/101  Pulse: (!) 55  SpO2: 100%  Weight: 117 lb (53.1 kg)  Height: 5' 4 (1.626 m)    GENERAL: Well-appearing, in NAD. Well nourished.  SKIN: Pink, warm and dry.  Head: Normocephalic. NECK: Trachea midline. Full ROM w/o pain or tenderness.  RESPIRATORY: Chest wall symmetrical. Respirations even and non-labored.  GI: Abdomen soft, non-tender. Normoactive bowel sounds. No rebound tenderness. No hepatomegaly or splenomegaly. No CVA tenderness.  GU: External genitalia without erythema, lesions, or masses. No lymphadenopathy. Vaginal mucosa pink and moist with white thick exudate, no lesions, or ulcerations. Cervix pink with white thick discharge. Cervical os closed. Uterus and adnexae palpable, not enlarged, and w/o tenderness. No palpable masses. Chaperoned by Rosina Ada, DNP Student RN BSN MSK: Muscle tone and strength appropriate for age.  EXTREMITIES: Without clubbing, cyanosis, or edema.  NEUROLOGIC: No motor or sensory deficits. Steady, even gait. C2-C12 intact.  PSYCH/MENTAL STATUS: Alert, oriented x 3. Cooperative, appropriate mood and affect.    Results for orders placed or performed in visit on 07/30/24  POCT URINALYSIS DIP (CLINITEK)  Result Value Ref Range   Color, UA yellow yellow   Clarity, UA clear clear   Glucose, UA negative negative mg/dL   Bilirubin, UA negative negative   Ketones, POC UA negative negative mg/dL   Spec Grav, UA 8.974 8.989 - 1.025   Blood, UA negative negative   pH, UA 7.0 5.0 - 8.0   POC PROTEIN,UA  negative negative, trace   Urobilinogen, UA 0.2 0.2 or 1.0 E.U./dL   Nitrite, UA Negative Negative   Leukocytes, UA Negative Negative      Assessment & Plan:  1. Yeast infection (Primary) Will treat with Diflucan  for two doses. Discussed prevention with patient.   2. Encounter for assessment of sexually transmitted disease exposure Vaginal swab obtained during pelvic exam. Pt will obtain bloodwork for RPR, Hep C and HIV testing.  - RPR - Cervicovaginal ancillary only  3. Encounter for hepatitis C screening test for low risk patient - Hepatitis C antibody  4. Encounter for screening for HIV - HIV Antibody (routine testing w rflx)  5. Pelvic pain UA is negative for UTI infection. Will obtain vaginal swab testing to rule out contributing STI.  - POCT URINALYSIS DIP (CLINITEK)   Meds ordered this encounter  Medications   fluconazole  (DIFLUCAN ) 150 MG tablet    Sig: Take 1 tablet (150 mg total) by mouth once for 1 dose. May repeat after 3 days if needed.    Dispense:  2 tablet    Refill:  0    Supervising Provider:   DE CUBA, RAYMOND J [id number]   Lab Orders  HIV Antibody (routine testing w rflx)         Hepatitis C antibody         RPR         POCT URINALYSIS DIP (CLINITEK)     No images are attached to the encounter or orders placed in the encounter.  Return if symptoms worsen or fail to improve.    Patient to reach out to office if new, worrisome, or unresolved symptoms arise or if no improvement in patient's condition. Patient verbalized understanding and is agreeable to treatment plan. All questions answered to patient's satisfaction.    Thersia Schuyler Stark, [place]

## 2024-07-31 LAB — CERVICOVAGINAL ANCILLARY ONLY
Bacterial Vaginitis (gardnerella): POSITIVE — AB
Candida Glabrata: NEGATIVE
Candida Vaginitis: POSITIVE — AB
Chlamydia: NEGATIVE
Comment: NEGATIVE
Comment: NEGATIVE
Comment: NEGATIVE
Comment: NEGATIVE
Comment: NEGATIVE
Comment: NORMAL
Neisseria Gonorrhea: NEGATIVE
Trichomonas: NEGATIVE

## 2024-08-02 ENCOUNTER — Ambulatory Visit (HOSPITAL_BASED_OUTPATIENT_CLINIC_OR_DEPARTMENT_OTHER): Payer: Self-pay | Admitting: Family Medicine

## 2024-08-02 MED ORDER — METRONIDAZOLE 500 MG PO TABS: 500.0000 mg | ORAL_TABLET | Freq: Two times a day (BID) | ORAL | 0 refills | Status: DC

## 2024-08-02 NOTE — Progress Notes (Signed)
    You have an overgrowth of bacteria in your vagina called Bacterial Vaginosis. You also have yeast which is treated by the Diflucan  prescribed.   This is not a sexually transmitted infection.  Sexual partners do not need to be treated, however abstaining from sex or using condoms may prevent recurrence of the overgrowth. Some women have a recurrence of the overgrowth even when fully treated.  Call the office if your symptoms begin again.  Do not douche.  This is associated with decreased cure rates and more bacterial overgrowths. Take the full course of the antibiotic prescribed to you even if you begin to feel better. Do not drink alcohol with the antibiotic flagyl  (metronidazole ) as this drug will cause nausea and severe vomiting if you drink while taking antibiotic.

## 2024-08-24 ENCOUNTER — Encounter (HOSPITAL_BASED_OUTPATIENT_CLINIC_OR_DEPARTMENT_OTHER): Payer: Self-pay | Admitting: Family Medicine

## 2024-08-27 MED ORDER — METRONIDAZOLE 500 MG PO TABS: 500.0000 mg | ORAL_TABLET | Freq: Two times a day (BID) | ORAL | 0 refills | Status: AC

## 2024-09-25 ENCOUNTER — Encounter (HOSPITAL_BASED_OUTPATIENT_CLINIC_OR_DEPARTMENT_OTHER): Payer: Self-pay

## 2024-10-02 ENCOUNTER — Ambulatory Visit (HOSPITAL_BASED_OUTPATIENT_CLINIC_OR_DEPARTMENT_OTHER): Payer: MEDICAID | Admitting: Family Medicine

## 2024-10-05 ENCOUNTER — Ambulatory Visit (HOSPITAL_BASED_OUTPATIENT_CLINIC_OR_DEPARTMENT_OTHER): Payer: MEDICAID | Admitting: Family Medicine

## 2024-10-06 ENCOUNTER — Ambulatory Visit (HOSPITAL_BASED_OUTPATIENT_CLINIC_OR_DEPARTMENT_OTHER): Payer: MEDICAID | Admitting: Family Medicine

## 2024-10-08 ENCOUNTER — Ambulatory Visit (INDEPENDENT_AMBULATORY_CARE_PROVIDER_SITE_OTHER): Payer: MEDICAID

## 2024-10-08 ENCOUNTER — Encounter (HOSPITAL_BASED_OUTPATIENT_CLINIC_OR_DEPARTMENT_OTHER): Payer: Self-pay

## 2024-10-08 VITALS — BP 138/102 | HR 74 | Temp 98.9°F | Resp 20 | Ht 64.0 in | Wt 122.0 lb

## 2024-10-08 DIAGNOSIS — D649 Anemia, unspecified: Secondary | ICD-10-CM

## 2024-10-08 NOTE — Progress Notes (Signed)
 "    Subjective:   Mandy Wilson 1986-07-25 10/08/2024  HPI: Canna Nickelson presents today for re-assessment and management of chronic medical conditions.  Pt here for lab work in order to donate plasma. States she needs a total protein level checked.   Pt other concern is increase in fatigue. States she has a history of anemia and has not had recent labs check. States she is cold a lot as well as notices even when she gets a lot of sleep she still wakes up tired. Requesting lab work today.   The following portions of the patient's history were reviewed and updated as appropriate: past medical history, past surgical history, family history, social history, allergies, medications, and problem list.   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   Past Medical History:  Diagnosis Date   Anal fissure    recurrent   Anemia    Anxiety    Bipolar 1 disorder (HCC)    Depression    History of anal fissures    Polysubstance abuse (HCC)    cocaine/ marjiuana   Substance abuse (HCC)    Past Surgical History:  Procedure Laterality Date   CESAREAN SECTION  x4  last one 06-19-2017   FRACTURE SURGERY     OPEN REDUCTION INTERNAL FIXATION (ORIF) DISTAL PHALANX Right 09/02/2019   Procedure: OPEN REDUCTION INTERNAL FIXATION (ORIF) right long finger malunion repair;  Surgeon: Shari Easter, MD;  Location: [facility];  Service: Orthopedics;  Laterality: Right;   RECTAL EXAM UNDER ANESTHESIA N/A 02/17/2020   Procedure: ANAL EXAM UNDER  ANESTHESIA;  Surgeon: Debby Hila, MD;  Location: [facility];  Service: General;  Laterality: N/A;   SPHINCTEROTOMY N/A 02/17/2020   Procedure: CHEMICAL SPHINCTEROTOMY BOTOX , HEMORRHOIDPEXY;  Surgeon: Debby Hila, MD;  Location: [facility];  Service: General;  Laterality: N/A;   SPHINCTEROTOMY N/A 05/25/2020   Procedure: LATERAL INTERNAL SPHINCTEROTOMY;  Surgeon: Debby Hila, MD;  Location: [facility];  Service: General;  Laterality: N/A;   Family History  Problem Relation Age of Onset   Early death Mother 32   Asthma Mother    Anxiety disorder Mother    Depression Mother    Heart disease Mother    Early death Father 36   Asthma Father    Outpatient Medications Prior to Visit  Medication Sig Dispense Refill   doxycycline  (VIBRA -TABS) 100 MG tablet Take 1 tablet (100 mg total) by mouth 2 (two) times daily. 14 tablet 0   No facility-administered medications prior to visit.   Allergies[1]   ROS: A complete ROS was performed with pertinent positives/negatives noted in the HPI. The remainder of the ROS are negative.    Objective:   Today's Vitals   10/08/24 1459  BP: (!) 138/102  Pulse: 74  Resp: 20  Temp: 98.9 F (37.2 C)  TempSrc: Oral  SpO2: 100%  Weight: 122 lb (55.3 kg)  Height: 5' 4 (1.626 m)  PainSc: 0-No pain     GENERAL: Well-appearing, in NAD. Well nourished.  SKIN: Pink, warm and dry.  No rash, lesion, ulceration, or ecchymoses.  RESPIRATORY: Chest wall symmetrical. Respirations even and non-labored. Breath sounds clear to auscultation bilaterally.  CARDIAC: S1, S2 present, regular rate and rhythm without murmur or gallops. Peripheral pulses 2+ bilaterally.  MSK: Muscle tone and strength appropriate for age. Joints w/o tenderness, redness, or swelling.  EXTREMITIES: Without clubbing, cyanosis, or edema.  NEUROLOGIC: No motor or sensory deficits. Steady, even gait. C2-C12 intact.  PSYCH/MENTAL STATUS:  Alert, oriented x 3. Cooperative, appropriate mood and affect.   Health Maintenance Due  Topic Date Due   Hepatitis B Vaccines 19-59 Average Risk (1 of 3 - 19+ 3-dose series) Never done   COVID-19 Vaccine (1 - 2025-26 season) Never done    No results found for any visits on 10/08/24.  The ASCVD Risk score (Arnett DK, et al., 2019) failed to calculate for the following reasons:   The 2019 ASCVD risk score is only valid for ages 96 to 57     Assessment & Plan:  1. Anemia, unspecified type (Primary) Lab work placed today for plasma as well as concern for anemia. Discussed patient will be notified of results and if supplementation is needed. - CBC with Differential/Platelet - Protein, total - Iron, TIBC and Ferritin Panel  No images are attached to the encounter or orders placed in the encounter.  Return if symptoms worsen or fail to improve.    Patient to reach out to office if new, worrisome, or unresolved symptoms arise or if no improvement in patient's condition. Patient verbalized understanding and is agreeable to treatment plan. All questions answered to patient's satisfaction.    Lauraine Almarie Angus DNP, FNP-C     [1]  Allergies Allergen Reactions   Shellfish Allergy Hives    Lips tingle, facial swelling   "

## 2024-10-09 ENCOUNTER — Ambulatory Visit (HOSPITAL_BASED_OUTPATIENT_CLINIC_OR_DEPARTMENT_OTHER): Payer: Self-pay

## 2024-10-09 ENCOUNTER — Encounter (HOSPITAL_BASED_OUTPATIENT_CLINIC_OR_DEPARTMENT_OTHER): Payer: Self-pay

## 2024-10-09 LAB — CBC WITH DIFFERENTIAL/PLATELET
Basophils Absolute: 0 x10E3/uL (ref 0.0–0.2)
Basos: 1 %
EOS (ABSOLUTE): 0.1 x10E3/uL (ref 0.0–0.4)
Eos: 2 %
Hematocrit: 35.4 % (ref 34.0–46.6)
Hemoglobin: 11.6 g/dL (ref 11.1–15.9)
Immature Grans (Abs): 0 x10E3/uL (ref 0.0–0.1)
Immature Granulocytes: 0 %
Lymphocytes Absolute: 1.8 x10E3/uL (ref 0.7–3.1)
Lymphs: 32 %
MCH: 29.5 pg (ref 26.6–33.0)
MCHC: 32.8 g/dL (ref 31.5–35.7)
MCV: 90 fL (ref 79–97)
Monocytes Absolute: 0.5 x10E3/uL (ref 0.1–0.9)
Monocytes: 8 %
Neutrophils Absolute: 3.2 x10E3/uL (ref 1.4–7.0)
Neutrophils: 57 %
Platelets: 214 x10E3/uL (ref 150–450)
RBC: 3.93 x10E6/uL (ref 3.77–5.28)
RDW: 13 % (ref 11.7–15.4)
WBC: 5.7 x10E3/uL (ref 3.4–10.8)

## 2024-10-09 LAB — IRON,TIBC AND FERRITIN PANEL
Ferritin: 48 ng/mL (ref 15–150)
Iron Saturation: 21 % (ref 15–55)
Iron: 83 ug/dL (ref 27–159)
Total Iron Binding Capacity: 391 ug/dL (ref 250–450)
UIBC: 308 ug/dL (ref 131–425)

## 2024-10-09 LAB — PROTEIN, TOTAL: Total Protein: 7 g/dL (ref 6.0–8.5)

## 2024-10-09 NOTE — Progress Notes (Signed)
 Hi Jonnette, Your blood work looks great with no signs of anemia. Your total protein was 7 on our recheck. I have filled out the form and they are faxing it back to the plasma center. Please let me know if you need anything else.   Lauraine Almarie Angus DNP, FNP-C

## 2024-10-13 ENCOUNTER — Ambulatory Visit (HOSPITAL_BASED_OUTPATIENT_CLINIC_OR_DEPARTMENT_OTHER): Payer: MEDICAID

## 2024-10-19 NOTE — Telephone Encounter (Signed)
 Routing pt's message back to Lauraine as an [employer].

## 2024-10-21 ENCOUNTER — Ambulatory Visit (HOSPITAL_BASED_OUTPATIENT_CLINIC_OR_DEPARTMENT_OTHER): Payer: MEDICAID | Admitting: Family Medicine
